# Patient Record
Sex: Female | Born: 1990 | Race: Black or African American | Hispanic: No | Marital: Single | Smoking: Never smoker
Health system: Southern US, Community
[De-identification: ages and names within clinical notes are randomized; demographics above are authoritative.]

## PROBLEM LIST (undated history)

## (undated) DIAGNOSIS — D649 Anemia, unspecified: Secondary | ICD-10-CM

## (undated) DIAGNOSIS — Z789 Other specified health status: Secondary | ICD-10-CM

## (undated) DIAGNOSIS — R87629 Unspecified abnormal cytological findings in specimens from vagina: Secondary | ICD-10-CM

## (undated) DIAGNOSIS — B009 Herpesviral infection, unspecified: Secondary | ICD-10-CM

## (undated) DIAGNOSIS — T7840XA Allergy, unspecified, initial encounter: Secondary | ICD-10-CM

## (undated) HISTORY — DX: Other specified health status: Z78.9

## (undated) HISTORY — DX: Allergy, unspecified, initial encounter: T78.40XA

## (undated) HISTORY — DX: Herpesviral infection, unspecified: B00.9

## (undated) HISTORY — DX: Unspecified abnormal cytological findings in specimens from vagina: R87.629

## (undated) HISTORY — DX: Anemia, unspecified: D64.9

---
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
## 2012-03-05 ENCOUNTER — Ambulatory Visit (INDEPENDENT_AMBULATORY_CARE_PROVIDER_SITE_OTHER): Payer: BC Managed Care – PPO | Admitting: Family Medicine

## 2012-03-05 ENCOUNTER — Ambulatory Visit: Payer: BC Managed Care – PPO

## 2012-03-05 VITALS — BP 131/76 | HR 76 | Temp 98.2°F | Resp 16 | Ht 64.0 in | Wt <= 1120 oz

## 2012-03-05 DIAGNOSIS — M79642 Pain in left hand: Secondary | ICD-10-CM

## 2012-03-05 DIAGNOSIS — M79609 Pain in unspecified limb: Secondary | ICD-10-CM

## 2012-03-05 DIAGNOSIS — L259 Unspecified contact dermatitis, unspecified cause: Secondary | ICD-10-CM

## 2012-03-05 DIAGNOSIS — T169XXA Foreign body in ear, unspecified ear, initial encounter: Secondary | ICD-10-CM

## 2012-03-05 DIAGNOSIS — L309 Dermatitis, unspecified: Secondary | ICD-10-CM

## 2012-03-05 MED ORDER — HYDROCORTISONE VALERATE 0.2 % EX OINT: TOPICAL_OINTMENT | Freq: Two times a day (BID) | CUTANEOUS | Status: DC

## 2012-03-05 MED ORDER — CETIRIZINE HCL 10 MG PO TABS: 10.0000 mg | ORAL_TABLET | Freq: Every day | ORAL | Status: DC
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
## 2012-03-05 NOTE — Progress Notes (Signed)
  Subjective:    Patient ID: Leah Cox, female    DOB: 28-Nov-1990, 21 y.o.   MRN: [id number]  HPI    Review of Systems     Objective:   Physical Exam        Assessment & Plan:  Reviewed.  Agree with assessment and plan

## 2012-03-05 NOTE — Patient Instructions (Signed)
Finger Sprain A finger sprain is a tear in one of the strong, fibrous tissues that connect the bones (ligaments) in your finger. The severity of the sprain depends on how much of the ligament is torn. The tear can be either partial or complete. CAUSES  Often, sprains are a result of a fall or accident. If you extend your hands to catch an object or to protect yourself, the force of the impact causes the fibers of your ligament to stretch too much. This excess tension causes the fibers of your ligament to tear. SYMPTOMS  You may have some loss of motion in your finger. Other symptoms include:  Bruising.   Tenderness.   Swelling.  DIAGNOSIS  In order to diagnose finger sprain, your caregiver will physically examine your finger or thumb to determine how torn the ligament is. Your caregiver may also suggest an X-ray exam of your finger to make sure no bones are broken. TREATMENT  If your ligament is only partially torn, treatment usually involves keeping the finger in a fixed position (immobilization) for a short period. To do this, your caregiver will apply a bandage, cast, or splint to keep your finger from moving until it heals. For a partially torn ligament, the healing process usually takes 2 to 3 weeks. If your ligament is completely torn, you may need surgery to reconnect the ligament to the bone. After surgery a cast or splint will be applied and will need to stay on your finger or thumb for 4 to 6 weeks while your ligament heals. HOME CARE INSTRUCTIONS  Keep your injured finger elevated, when possible, to decrease swelling.   To ease pain and swelling, apply ice to your joint twice a day, for 2 to 3 days:   Put ice in a plastic bag.   Place a towel between your skin and the bag.   Leave the ice on for 15 minutes.   Only take over-the-counter or prescription medicine for pain as directed by your caregiver.   Do not wear rings on your injured finger.   Do not leave your finger  unprotected until pain and stiffness go away (usually 3 to 4 weeks).   Do not allow your cast or splint to get wet. Cover your cast or splint with a plastic bag when you shower or bathe. Do not swim.   Your caregiver may suggest special exercises for you to do during your recovery to prevent or limit permanent stiffness.  SEEK IMMEDIATE MEDICAL CARE IF:  Your cast or splint becomes damaged.   Your pain becomes worse rather than better.  MAKE SURE YOU:  Understand these instructions.   Will watch your condition.   Will get help right away if you are not doing well or get worse.  Document Released: 10/31/2004 Document Revised: 09/12/2011 Document Reviewed: 05/27/2011 [facility] Patient Information 2012 [place], [place].  Eczema Atopic dermatitis, or eczema, is an inherited type of sensitive skin. Often people with eczema have a family history of allergies, asthma, or hay fever. It causes a red itchy rash and dry scaly skin. The itchiness may occur before the skin rash and may be very intense. It is not contagious. Eczema is generally worse during the cooler winter months and often improves with the warmth of summer. Eczema usually starts showing signs in infancy. Some children outgrow eczema, but it may last through adulthood. Flare-ups may be caused by:  Eating something or contact with something you are sensitive or allergic to.   Stress.  DIAGNOSIS  The diagnosis of eczema is usually based upon symptoms and medical history. TREATMENT  Eczema cannot be cured, but symptoms usually can be controlled with treatment or avoidance of allergens (things to which you are sensitive or allergic to).  Controlling the itching and scratching.   Use over-the-counter antihistamines as directed for itching. It is especially useful at night when the itching tends to be worse.   Use over-the-counter steroid creams as directed for itching.   Scratching makes the rash and itching worse and may cause  impetigo (a skin infection) if fingernails are contaminated (dirty).   Keeping the skin well moisturized with creams every day. This will seal in moisture and help prevent dryness. Lotions containing alcohol and water can dry the skin and are not recommended.   Limiting exposure to allergens.   Recognizing situations that cause stress.   Developing a plan to manage stress.  HOME CARE INSTRUCTIONS   Take prescription and over-the-counter medicines as directed by your caregiver.   Do not use anything on the skin without checking with your caregiver.   Keep baths or showers short (5 minutes) in warm (not hot) water. Use mild cleansers for bathing. You may add non-perfumed bath oil to the bath water. It is best to avoid soap and bubble bath.   Immediately after a bath or shower, when the skin is still damp, apply a moisturizing ointment to the entire body. This ointment should be a petroleum ointment. This will seal in moisture and help prevent dryness. The thicker the ointment the better. These should be unscented.   Keep fingernails cut short and wash hands often. If your child has eczema, it may be necessary to put soft gloves or mittens on your child at night.   Dress in clothes made of cotton or cotton blends. Dress lightly, as heat increases itching.   Avoid foods that may cause flare-ups. Common foods include cow's milk, peanut butter, eggs and wheat.   Keep a child with eczema away from anyone with fever blisters. The virus that causes fever blisters (herpes simplex) can cause a serious skin infection in children with eczema.  SEEK MEDICAL CARE IF:   Itching interferes with sleep.   The rash gets worse or is not better within one week following treatment.   The rash looks infected (pus or soft yellow scabs).   You or your child has an oral temperature above 102 F (38.9 C).   Your baby is older than 3 months with a rectal temperature of 100.5 F (38.1 C) or higher for more  than 1 day.   The rash flares up after contact with someone who has fever blisters.  SEEK IMMEDIATE MEDICAL CARE IF:   Your baby is older than 3 months with a rectal temperature of 102 F (38.9 C) or higher.   Your baby is older than 3 months or younger with a rectal temperature of 100.4 F (38 C) or higher.  Document Released: 09/20/2000 Document Revised: 09/12/2011 Document Reviewed: 07/26/2009 [facility] Patient Information 2012 [place], [place].

## 2013-03-25 ENCOUNTER — Other Ambulatory Visit: Payer: Self-pay | Admitting: *Deleted

## 2013-04-30 ENCOUNTER — Ambulatory Visit (INDEPENDENT_AMBULATORY_CARE_PROVIDER_SITE_OTHER): Payer: BC Managed Care – PPO | Admitting: Physician Assistant

## 2013-04-30 VITALS — BP 122/68 | HR 81 | Temp 98.1°F | Resp 16 | Ht 66.0 in | Wt 198.0 lb

## 2013-04-30 DIAGNOSIS — J029 Acute pharyngitis, unspecified: Secondary | ICD-10-CM

## 2013-04-30 LAB — POCT RAPID STREP A (OFFICE): Rapid Strep A Screen: NEGATIVE

## 2013-04-30 MED ORDER — TRIAMCINOLONE ACETONIDE(NASAL) 55 MCG/ACT NA INHA: 2.0000 | Freq: Every day | NASAL | Status: DC

## 2013-04-30 MED ORDER — GUAIFENESIN ER 1200 MG PO TB12: 1.0000 | ORAL_TABLET | Freq: Two times a day (BID) | ORAL | Status: AC

## 2013-04-30 NOTE — Progress Notes (Signed)
   [address] [place] [address]   Phone [phone]  Subjective:    Patient ID: Leah Cox, female    DOB: 1991-08-12, 22 y.o.   MRN: [id number]  HPI Pt presents to clinic with sore throat that is intermittent - worse at night but it never goes away and she feels like from when it started 1 week ago the pain is worse overall.  She has some congestion but no rhinorrhea and she has seasonal allergies but this feels different.  She is having trouble at work because she talks a lot and her voice keeps getting hoarse.    No strep exposures. Works with the public. OTC meds - cold preps without much relief.  Review of Systems  Constitutional: Positive for fever (about 100 at onset of illness). Negative for chills.  HENT: Positive for congestion, sore throat (worse at night), voice change (intermittent voice loss) and postnasal drip. Negative for rhinorrhea.   Respiratory: Positive for cough.   Gastrointestinal: Negative for nausea, vomiting and diarrhea.  Musculoskeletal: Positive for myalgias.  Neurological: Positive for headaches.       Objective:   Physical Exam  Vitals reviewed. Constitutional: She is oriented to person, place, and time. She appears well-developed and well-nourished.  HENT:  Head: Normocephalic and atraumatic.  Right Ear: Hearing, tympanic membrane, external ear and ear canal normal.  Left Ear: Hearing, tympanic membrane, external ear and ear canal normal.  Nose: Mucosal edema (pale and swollen) present.  Mouth/Throat: Uvula is midline, oropharynx is clear and moist and mucous membranes are normal.  Eyes: Conjunctivae are normal.  Cardiovascular: Normal rate, regular rhythm and normal heart sounds.   No murmur heard. Pulmonary/Chest: Effort normal and breath sounds normal.  Neurological: She is alert and oriented to person, place, and time.  Skin: Skin is warm and dry.  Psychiatric: She has a normal mood and affect. Her behavior is normal. Judgment and  thought content normal.          Assessment & Plan:  Acute pharyngitis - Plan: POCT rapid strep A, Guaifenesin (MUCINEX MAXIMUM STRENGTH) 1200 MG TB12, triamcinolone (NASACORT) 55 MCG/ACT nasal inhaler  Push fluids.  Tylenol/motrin prn.  Benny Lennert PA-C 04/30/2013 11:22 AM

## 2013-05-25 ENCOUNTER — Encounter (HOSPITAL_COMMUNITY): Payer: Self-pay | Admitting: Emergency Medicine

## 2013-05-25 ENCOUNTER — Emergency Department (INDEPENDENT_AMBULATORY_CARE_PROVIDER_SITE_OTHER)
Admission: EM | Admit: 2013-05-25 | Discharge: 2013-05-25 | Disposition: A | Discharge status: Home / Self Care | Payer: Federal, State, Local not specified - PPO | Source: Home / Self Care | Attending: Emergency Medicine | Admitting: Emergency Medicine

## 2013-05-25 DIAGNOSIS — M549 Dorsalgia, unspecified: Secondary | ICD-10-CM

## 2013-05-25 MED ORDER — CYCLOBENZAPRINE HCL 5 MG PO TABS: 5.0000 mg | ORAL_TABLET | Freq: Three times a day (TID) | ORAL | Status: DC | PRN

## 2013-05-25 MED ORDER — IBUPROFEN 800 MG PO TABS: 800.0000 mg | ORAL_TABLET | Freq: Three times a day (TID) | ORAL | Status: DC

## 2013-05-25 NOTE — ED Provider Notes (Signed)
Medical screening examination/treatment/procedure(s) were performed by non-physician practitioner and as supervising physician I was immediately available for consultation/collaboration.  Bolivar Koranda, M.D.  Anishka Bushard C Takina Busser, MD 05/25/13 2214 
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
## 2013-10-06 ENCOUNTER — Other Ambulatory Visit: Payer: Self-pay | Admitting: Family

## 2013-10-06 DIAGNOSIS — R590 Localized enlarged lymph nodes: Secondary | ICD-10-CM

## 2013-10-18 ENCOUNTER — Ambulatory Visit
Admission: RE | Admit: 2013-10-18 | Discharge: 2013-10-18 | Disposition: A | Discharge status: Home / Self Care | Payer: Federal, State, Local not specified - PPO | Source: Ambulatory Visit | Attending: Family | Admitting: Family

## 2013-10-18 DIAGNOSIS — R590 Localized enlarged lymph nodes: Secondary | ICD-10-CM

## 2014-03-23 ENCOUNTER — Ambulatory Visit (INDEPENDENT_AMBULATORY_CARE_PROVIDER_SITE_OTHER): Payer: BC Managed Care – PPO | Admitting: Family Medicine

## 2014-03-23 VITALS — BP 102/70 | HR 66 | Temp 97.9°F | Resp 16 | Ht 64.0 in | Wt 192.2 lb

## 2014-03-23 DIAGNOSIS — G8929 Other chronic pain: Secondary | ICD-10-CM

## 2014-03-23 DIAGNOSIS — R519 Headache, unspecified: Secondary | ICD-10-CM

## 2014-03-23 DIAGNOSIS — R51 Headache: Secondary | ICD-10-CM

## 2014-03-23 DIAGNOSIS — R3915 Urgency of urination: Secondary | ICD-10-CM

## 2014-03-23 LAB — POCT UA - MICROSCOPIC ONLY
Casts, Ur, LPF, POC: NEGATIVE
Crystals, Ur, HPF, POC: NEGATIVE
Mucus, UA: NEGATIVE
Yeast, UA: NEGATIVE

## 2014-03-23 LAB — POCT URINALYSIS DIPSTICK
Bilirubin, UA: NEGATIVE
Glucose, UA: NEGATIVE
Ketones, UA: NEGATIVE
Nitrite, UA: POSITIVE
Protein, UA: NEGATIVE
Spec Grav, UA: 1.01
Urobilinogen, UA: 1
pH, UA: 6.5

## 2014-03-23 MED ORDER — TOPIRAMATE 50 MG PO TABS: 50.0000 mg | ORAL_TABLET | Freq: Every day | ORAL | Status: DC

## 2014-03-23 MED ORDER — NITROFURANTOIN MONOHYD MACRO 100 MG PO CAPS: 100.0000 mg | ORAL_CAPSULE | Freq: Two times a day (BID) | ORAL | Status: DC

## 2014-03-23 NOTE — Progress Notes (Signed)
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  The urinary frequency was intially accompanied by bad odor.  Not currently sexually active>  LMP May 20th.  The headaches are frontal and throbbing, lasting several hours.  She has had years of headaches, but last couple months they have been more frequent.  No vomiting.  She has associated photophobia  No fever.  Objective:  NAD No CVAT Results for orders placed in visit on 03/23/14  POCT UA - MICROSCOPIC ONLY      Result Value Ref Range   WBC, Ur, HPF, POC 10-20     RBC, urine, microscopic 1-2     Bacteria, U Microscopic 4+     Mucus, UA neg     Epithelial cells, urine per micros 1-3     Crystals, Ur, HPF, POC neg     Casts, Ur, LPF, POC neg     Yeast, UA neg    POCT URINALYSIS DIPSTICK      Result Value Ref Range   Color, UA yellow     Clarity, UA cloudy     Glucose, UA neg     Bilirubin, UA neg     Ketones, UA neg     Spec Grav, UA 1.010     Blood, UA trace     pH, UA 6.5     Protein, UA neg     Urobilinogen, UA 1.0     Nitrite, UA positive     Leukocytes, UA large (3+)     Urinary urgency - Plan: POCT UA - Microscopic Only, POCT urinalysis dipstick, Urine culture, nitrofurantoin, macrocrystal-monohydrate, (MACROBID) 100 MG capsule, CANCELED: POCT UA - Microscopic Only, CANCELED: POCT urinalysis dipstick  Chronic headaches - Plan: topiramate (TOPAMAX) 50 MG tablet

## 2014-03-26 LAB — URINE CULTURE: Colony Count: >=100000

## 2014-07-01 ENCOUNTER — Ambulatory Visit (INDEPENDENT_AMBULATORY_CARE_PROVIDER_SITE_OTHER): Payer: BC Managed Care – PPO | Admitting: Family Medicine

## 2014-07-01 VITALS — BP 132/70 | HR 78 | Temp 97.9°F | Resp 16 | Ht 64.0 in | Wt 197.0 lb

## 2014-07-01 DIAGNOSIS — Q181 Preauricular sinus and cyst: Secondary | ICD-10-CM

## 2014-07-01 NOTE — Patient Instructions (Signed)
Leave your cyst alone- do not try to squeeze it. If it does not resolve over the next 3-4 weeks give me a call and I will refer you to an ENT doctor to take a look at it.  Let me know sooner if you have any other problems!

## 2014-07-01 NOTE — Progress Notes (Signed)
[facility] [address], [place] [address] [phone]  Date:  07/01/2014   Name:  Leah Cox   DOB:  04-Oct-1991   MRN:  [id number]  PCP:  No PCP Per Patient    Chief Complaint: bump by right ear   History of Present Illness:  Leah Cox is a 23 y.o. very pleasant female patient who presents with the following:  She has noted a tender bump in front of the right ear for about one week.  No ear pain, no ST, cough, no fever.   She is generally healthy  She uses topamax for headache prevention.    There are no active problems to display for this patient.   Past Medical History  Diagnosis Date  . Allergy     seasonal    History reviewed. No pertinent past surgical history.  History  Substance Use Topics  . Smoking status: Never Smoker   . Smokeless tobacco: Never Used  . Alcohol Use: No    Family History  Problem Relation Age of Onset  . Hypertension Mother   . Diabetes Sister     Insulin dependent    Allergies  Allergen Reactions  . Codeine Swelling    Medication list has been reviewed and updated.  Current Outpatient Prescriptions on File Prior to Visit  Medication Sig Dispense Refill  . ibuprofen (ADVIL,MOTRIN) 800 MG tablet Take 1 tablet (800 mg total) by mouth 3 (three) times daily.  30 tablet  0  . nitrofurantoin, macrocrystal-monohydrate, (MACROBID) 100 MG capsule Take 1 capsule (100 mg total) by mouth 2 (two) times daily.  14 capsule  0  . topiramate (TOPAMAX) 50 MG tablet Take 1 tablet (50 mg total) by mouth at bedtime.  30 tablet  6   No current facility-administered medications on file prior to visit.    Review of Systems:  As per HPI- otherwise negative.   Physical Examination: Filed Vitals:   07/01/14 1226  BP: 132/70  Pulse: 78  Temp: 97.9 F (36.6 C)  Resp: 16   Filed Vitals:   07/01/14 1226  Height:  (1.626 m)  Weight: 197 lb (89.359 kg)   Body mass index is 33.8 kg/(m^2). Ideal Body  Weight: Weight in (lb) to have BMI = 25: 145.3  GEN: WDWN, NAD, Non-toxic, A & O x 3, overweight, looks well HEENT: Atraumatic, Normocephalic. Neck supple. No masses, No LAD.  Bilateral TM wnl, oropharynx normal.  PEERL,EOMI.  There is a tiny cyst anterior to her right ear.  It is mobile and slightly tender.  She does have some crusting and irritation of her bilateral ear lobes due to a known nickel allergy- admits she is not always good at avoiding nickel jewelry  No other abnormal LAD noted.   Ears and Nose: No external deformity. CV: RRR, No M/G/R. No JVD. No thrill. No extra heart sounds. PULM: CTA B, no wheezes, crackles, rhonchi. No retractions. No resp. distress. No accessory muscle use. EXTR: No c/c/e NEURO Normal gait.  PSYCH: Normally interactive. Conversant. Not depressed or anxious appearing.  Calm demeanor. \  Assessment and Plan: Preauricular cyst  reassurance if not resolved in a few weeks I will refer her to ENT for further evaluation   Signed Abbe Amsterdam, MD

## 2014-09-24 ENCOUNTER — Ambulatory Visit (INDEPENDENT_AMBULATORY_CARE_PROVIDER_SITE_OTHER): Payer: BC Managed Care – PPO | Admitting: Family Medicine

## 2014-09-24 VITALS — BP 106/64 | HR 86 | Temp 98.2°F | Resp 16 | Ht 64.0 in | Wt 187.8 lb

## 2014-09-24 DIAGNOSIS — G44229 Chronic tension-type headache, not intractable: Secondary | ICD-10-CM

## 2014-09-24 MED ORDER — IBUPROFEN 800 MG PO TABS: 800.0000 mg | ORAL_TABLET | Freq: Three times a day (TID) | ORAL | Status: DC

## 2014-09-24 NOTE — Patient Instructions (Signed)
Headaches, Frequently Asked Questions MIGRAINE HEADACHES Q: What is migraine? What causes it? How can I treat it? A: Generally, migraine headaches begin as a dull ache. Then they develop into a constant, throbbing, and pulsating pain. You may experience pain at the temples. You may experience pain at the front or back of one or both sides of the head. The pain is usually accompanied by a combination of:  Nausea.  Vomiting.  Sensitivity to light and noise. Some people (about 15%) experience an aura (see below) before an attack. The cause of migraine is believed to be chemical reactions in the brain. Treatment for migraine may include over-the-counter or prescription medications. It may also include self-help techniques. These include relaxation training and biofeedback.  Q: What is an aura? A: About 15% of people with migraine get an "aura". This is a sign of neurological symptoms that occur before a migraine headache. You may see wavy or jagged lines, dots, or flashing lights. You might experience tunnel vision or blind spots in one or both eyes. The aura can include visual or auditory hallucinations (something imagined). It may include disruptions in smell (such as strange odors), taste or touch. Other symptoms include:  Numbness.  A "pins and needles" sensation.  Difficulty in recalling or speaking the correct word. These neurological events may last as long as 60 minutes. These symptoms will fade as the headache begins. Q: What is a trigger? A: Certain physical or environmental factors can lead to or "trigger" a migraine. These include:  Foods.  Hormonal changes.  Weather.  Stress. It is important to remember that triggers are different for everyone. To help prevent migraine attacks, you need to figure out which triggers affect you. Keep a headache diary. This is a good way to track triggers. The diary will help you talk to your healthcare professional about your condition. Q: Does  weather affect migraines? A: Bright sunshine, hot, humid conditions, and drastic changes in barometric pressure may lead to, or "trigger," a migraine attack in some people. But studies have shown that weather does not act as a trigger for everyone with migraines. Q: What is the link between migraine and hormones? A: Hormones start and regulate many of your body's functions. Hormones keep your body in balance within a constantly changing environment. The levels of hormones in your body are unbalanced at times. Examples are during menstruation, pregnancy, or menopause. That can lead to a migraine attack. In fact, about three quarters of all women with migraine report that their attacks are related to the menstrual cycle.  Q: Is there an increased risk of stroke for migraine sufferers? A: The likelihood of a migraine attack causing a stroke is very remote. That is not to say that migraine sufferers cannot have a stroke associated with their migraines. In persons under age 40, the most common associated factor for stroke is migraine headache. But over the course of a person's normal life span, the occurrence of migraine headache may actually be associated with a reduced risk of dying from cerebrovascular disease due to stroke.  Q: What are acute medications for migraine? A: Acute medications are used to treat the pain of the headache after it has started. Examples over-the-counter medications, NSAIDs, ergots, and triptans.  Q: What are the triptans? A: Triptans are the newest class of abortive medications. They are specifically targeted to treat migraine. Triptans are vasoconstrictors. They moderate some chemical reactions in the brain. The triptans work on receptors in your brain. Triptans help   to restore the balance of a neurotransmitter called serotonin. Fluctuations in levels of serotonin are thought to be a main cause of migraine.  Q: Are over-the-counter medications for migraine effective? A:  Over-the-counter, or "OTC," medications may be effective in relieving mild to moderate pain and associated symptoms of migraine. But you should see your caregiver before beginning any treatment regimen for migraine.  Q: What are preventive medications for migraine? A: Preventive medications for migraine are sometimes referred to as "prophylactic" treatments. They are used to reduce the frequency, severity, and length of migraine attacks. Examples of preventive medications include antiepileptic medications, antidepressants, beta-blockers, calcium channel blockers, and NSAIDs (nonsteroidal anti-inflammatory drugs). Q: Why are anticonvulsants used to treat migraine? A: During the past few years, there has been an increased interest in antiepileptic drugs for the prevention of migraine. They are sometimes referred to as "anticonvulsants". Both epilepsy and migraine may be caused by similar reactions in the brain.  Q: Why are antidepressants used to treat migraine? A: Antidepressants are typically used to treat people with depression. They may reduce migraine frequency by regulating chemical levels, such as serotonin, in the brain.  Q: What alternative therapies are used to treat migraine? A: The term "alternative therapies" is often used to describe treatments considered outside the scope of conventional Western medicine. Examples of alternative therapy include acupuncture, acupressure, and yoga. Another common alternative treatment is herbal therapy. Some herbs are believed to relieve headache pain. Always discuss alternative therapies with your caregiver before proceeding. Some herbal products contain arsenic and other toxins. TENSION HEADACHES Q: What is a tension-type headache? What causes it? How can I treat it? A: Tension-type headaches occur randomly. They are often the result of temporary stress, anxiety, fatigue, or anger. Symptoms include soreness in your temples, a tightening band-like sensation  around your head (a "vice-like" ache). Symptoms can also include a pulling feeling, pressure sensations, and contracting head and neck muscles. The headache begins in your forehead, temples, or the back of your head and neck. Treatment for tension-type headache may include over-the-counter or prescription medications. Treatment may also include self-help techniques such as relaxation training and biofeedback. CLUSTER HEADACHES Q: What is a cluster headache? What causes it? How can I treat it? A: Cluster headache gets its name because the attacks come in groups. The pain arrives with little, if any, warning. It is usually on one side of the head. A tearing or bloodshot eye and a runny nose on the same side of the headache may also accompany the pain. Cluster headaches are believed to be caused by chemical reactions in the brain. They have been described as the most severe and intense of any headache type. Treatment for cluster headache includes prescription medication and oxygen. SINUS HEADACHES Q: What is a sinus headache? What causes it? How can I treat it? A: When a cavity in the bones of the face and skull (a sinus) becomes inflamed, the inflammation will cause localized pain. This condition is usually the result of an allergic reaction, a tumor, or an infection. If your headache is caused by a sinus blockage, such as an infection, you will probably have a fever. An x-ray will confirm a sinus blockage. Your caregiver's treatment might include antibiotics for the infection, as well as antihistamines or decongestants.  REBOUND HEADACHES Q: What is a rebound headache? What causes it? How can I treat it? A: A pattern of taking acute headache medications too often can lead to a condition known as "rebound headache."   A pattern of taking too much headache medication includes taking it more than 2 days per week or in excessive amounts. That means more than the label or a caregiver advises. With rebound  headaches, your medications not only stop relieving pain, they actually begin to cause headaches. Doctors treat rebound headache by tapering the medication that is being overused. Sometimes your caregiver will gradually substitute a different type of treatment or medication. Stopping may be a challenge. Regularly overusing a medication increases the potential for serious side effects. Consult a caregiver if you regularly use headache medications more than 2 days per week or more than the label advises. ADDITIONAL QUESTIONS AND ANSWERS Q: What is biofeedback? A: Biofeedback is a self-help treatment. Biofeedback uses special equipment to monitor your body's involuntary physical responses. Biofeedback monitors:  Breathing.  Pulse.  Heart rate.  Temperature.  Muscle tension.  Brain activity. Biofeedback helps you refine and perfect your relaxation exercises. You learn to control the physical responses that are related to stress. Once the technique has been mastered, you do not need the equipment any more. Q: Are headaches hereditary? A: Four out of five (80%) of people that suffer report a family history of migraine. Scientists are not sure if this is genetic or a family predisposition. Despite the uncertainty, a child has a 50% chance of having migraine if one parent suffers. The child has a 75% chance if both parents suffer.  Q: Can children get headaches? A: By the time they reach high school, most young people have experienced some type of headache. Many safe and effective approaches or medications can prevent a headache from occurring or stop it after it has begun.  Q: What type of doctor should I see to diagnose and treat my headache? A: Start with your primary caregiver. Discuss his or her experience and approach to headaches. Discuss methods of classification, diagnosis, and treatment. Your caregiver may decide to recommend you to a headache specialist, depending upon your symptoms or other  physical conditions. Having diabetes, allergies, etc., may require a more comprehensive and inclusive approach to your headache. The National Headache Foundation will provide, upon request, a list of [facility] physician members in your state. Document Released: 12/14/2003 Document Revised: 12/16/2011 Document Reviewed: 05/23/2008 [facility] Patient Information 2015 [place], [place]. This information is not intended to replace advice given to you by your health care provider. Make sure you discuss any questions you have with your health care provider. Ibuprofen tablets and capsules What is this medicine? IBUPROFEN (eye BYOO proe fen) is a non-steroidal anti-inflammatory drug (NSAID). It is used for dental pain, fever, headaches or migraines, osteoarthritis, rheumatoid arthritis, or painful monthly periods. It can also relieve minor aches and pains caused by a cold, flu, or sore throat. This medicine may be used for other purposes; ask your health care provider or pharmacist if you have questions. COMMON BRAND NAME(S): Advil, Advil Junior Strength, Advil Migraine, Genpril, Ibren, IBU, Midol, Midol Cramps and Body Aches, Motrin, Motrin IB, Motrin Junior Strength, Motrin Migraine Pain, Samson-8, Toxicology Saliva Collection What should I tell my health care provider before I take this medicine? They need to know if you have any of these conditions: -asthma -cigarette smoker -drink more than 3 alcohol containing drinks a day -heart disease or circulation problems such as heart failure or leg edema (fluid retention) -high blood pressure -kidney disease -liver disease -stomach bleeding or ulcers -an unusual or allergic reaction to ibuprofen, aspirin, other NSAIDS, other medicines, foods, dyes, or preservatives -  pregnant or trying to get pregnant -breast-feeding How should I use this medicine? Take this medicine by mouth with a glass of water. Follow the directions on the prescription label. Take this medicine  with food if your stomach gets upset. Try to not lie down for at least 10 minutes after you take the medicine. Take your medicine at regular intervals. Do not take your medicine more often than directed. A special MedGuide will be given to you by the pharmacist with each prescription and refill. Be sure to read this information carefully each time. Talk to your pediatrician regarding the use of this medicine in children. Special care may be needed. Overdosage: If you think you have taken too much of this medicine contact a poison control center or emergency room at once. NOTE: This medicine is only for you. Do not share this medicine with others. What if I miss a dose? If you miss a dose, take it as soon as you can. If it is almost time for your next dose, take only that dose. Do not take double or extra doses. What may interact with this medicine? Do not take this medicine with any of the following medications: -cidofovir -ketorolac -methotrexate -pemetrexed This medicine may also interact with the following medications: -alcohol -aspirin -diuretics -lithium -other drugs for inflammation like prednisone -warfarin This list may not describe all possible interactions. Give your health care provider a list of all the medicines, herbs, non-prescription drugs, or dietary supplements you use. Also tell them if you smoke, drink alcohol, or use illegal drugs. Some items may interact with your medicine. What should I watch for while using this medicine? Tell your doctor or healthcare professional if your symptoms do not start to get better or if they get worse. This medicine does not prevent heart attack or stroke. In fact, this medicine may increase the chance of a heart attack or stroke. The chance may increase with longer use of this medicine and in people who have heart disease. If you take aspirin to prevent heart attack or stroke, talk with your doctor or health care professional. Do not take  other medicines that contain aspirin, ibuprofen, or naproxen with this medicine. Side effects such as stomach upset, nausea, or ulcers may be more likely to occur. Many medicines available without a prescription should not be taken with this medicine. This medicine can cause ulcers and bleeding in the stomach and intestines at any time during treatment. Ulcers and bleeding can happen without warning symptoms and can cause death. To reduce your risk, do not smoke cigarettes or drink alcohol while you are taking this medicine. You may get drowsy or dizzy. Do not drive, use machinery, or do anything that needs mental alertness until you know how this medicine affects you. Do not stand or sit up quickly, especially if you are an older patient. This reduces the risk of dizzy or fainting spells. This medicine can cause you to bleed more easily. Try to avoid damage to your teeth and gums when you brush or floss your teeth. This medicine may be used to treat migraines. If you take migraine medicines for 10 or more days a month, your migraines may get worse. Keep a diary of headache days and medicine use. Contact your healthcare professional if your migraine attacks occur more frequently. What side effects may I notice from receiving this medicine? Side effects that you should report to your doctor or health care professional as soon as possible: -allergic reactions like skin  rash, itching or hives, swelling of the face, lips, or tongue -severe stomach pain -signs and symptoms of bleeding such as bloody or black, tarry stools; red or dark-brown urine; spitting up blood or brown material that looks like coffee grounds; red spots on the skin; unusual bruising or bleeding from the eye, gums, or nose -signs and symptoms of a blood clot such as changes in vision; chest pain; severe, sudden headache; trouble speaking; sudden numbness or weakness of the face, arm, or leg -unexplained weight gain or swelling -unusually  weak or tired -yellowing of eyes or skin Side effects that usually do not require medical attention (report to your doctor or health care professional if they continue or are bothersome): -bruising -diarrhea -dizziness, drowsiness -headache -nausea, vomiting This list may not describe all possible side effects. Call your doctor for medical advice about side effects. You may report side effects to FDA at 1-800-FDA-1088. Where should I keep my medicine? Keep out of the reach of children. Store at room temperature between 15 and 30 degrees C (59 and 86 degrees F). Keep container tightly closed. Throw away any unused medicine after the expiration date. NOTE: This sheet is a summary. It may not cover all possible information. If you have questions about this medicine, talk to your doctor, pharmacist, or health care provider.  2015, [employer] Standard. (2013-05-25 10:48:02) Analgesic Rebound Headaches An analgesic rebound headache is a headache that returns after pain medicine (analgesic) that was taken to treat the initial headache wears off. People who suffer from tension, migraine, or cluster headaches are at risk for developing rebound headaches. Any type of primary headache can return as a rebound headache if you regularly take analgesics more than three times a week. If the cycle of rebound headaches continues, they become chronic daily headaches.  CAUSES Analgesics frequently associated with this problem include common over-the-counter medicines like aspirin, ibuprofen, acetaminophen, sinus relief medicines, and other medicines that contain caffeine. Narcotic pain medicines are also a common cause of rebound headaches.  SIGNS AND SYMPTOMS The symptoms of rebound headaches are the same as the symptoms of your initial headache. Symptoms of specific types of headaches include: Tension headache  Pressure around the head.  Dull, aching head pain.  Pain felt over the front and sides of the  head.  Tenderness in the muscles of the head, neck and shoulders. Migraine Headache  Pulsing or throbbing pain on one or both sides of the head.  Severe pain that interferes with daily activities.  Pain that is worsened by physical activity.  Nausea, vomiting, or both.  Pain with exposure to bright light, loud noises, or strong smells.  General sensitivity to bright light, loud noises, or strong smells.  Visual changes.  Numbness of one or both arms. Cluster Headaches  Severe pain that begins in or around one eye or temple.  Redness in the eye on the same side as the pain.  Droopy or swollen eyelid.  One-sided head pain.  Nausea.  Runny nose.  Sweaty, pale facial skin.  Restlessness. DIAGNOSIS  Analgesic rebound headaches are diagnosed by reviewing your medical history. This includes the nature of your initial headaches, as well as the type of pain medicines you have been using to treat your headaches and how often you take them. TREATMENT Discontinuing frequent use of the analgesic medicine will typically reduce the frequency of the rebound episodes. This may initially worsen your headaches but eventually the pain should become more manageable, less frequent, and less severe.  Seeing a headache specialists may helpful. He or she may be able to help you manage your headaches and to make sure there is not another cause of the headaches. Alternative methods of stress relief such as acupuncture, counseling, biofeedback, and massage may also be helpful. Talk with your health care provider about which alternative treatments might be good for you. HOME CARE INSTRUCTIONS Stopping the regular use of pain medicine can be difficult. Follow your health care provider's instructions carefully. Keep all of your appointments. Avoid triggers that are known to cause your primary headaches. SEEK MEDICAL CARE IF: You continue to experience headaches after following your health care  provider's recommended treatments. SEEK IMMEDIATE MEDICAL CARE IF:  You develop new headache pain.  You develop headache pain that is different than what you have experienced in the past.  You develop numbness or tingling in your arms or legs.  You develop changes in your speech or vision. MAKE SURE YOU:  Understand these instructions.  Will watch your child's condition.  Will get help right away if your child is not doing well or gets worse. Document Released: 12/14/2003 Document Revised: 02/07/2014 Document Reviewed: 04/08/2013 [facility] Patient Information 2015 [place], [place]. This information is not intended to replace advice given to you by your health care provider. Make sure you discuss any questions you have with your health care provider.

## 2014-09-24 NOTE — Progress Notes (Signed)
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 Once or twice a week she developed a headache which is treated successfully with ibuprofen 800 mg. She never has a headache with twice a week, and one ibuprofen works. She does not have nausea, visual disturbance, loss of balance, or fever  Patient's headaches are constant and frontal  Patient's had this problem for over 6 months. She's come trouble taking the ibuprofen and understands that taking higher doses can lead to kidney problems.  Objective: No acute distress Fundi normal Ears: Normal Oropharynx: Normal : Neck supple, no adenopathy or thyromegaly Chest: Clear Heart: Regular no murmur Skin: Warm and dry without rash  Assessment: This chart was scribed in my presence and reviewed by me personally.    ICD-9-CM ICD-10-CM   1. Chronic tension-type headache, not intractable 339.12 G44.229 ibuprofen (ADVIL,MOTRIN) 800 MG tablet     Signed, Elvina SidleKurt Nadie Fiumara, MD

## 2015-06-26 ENCOUNTER — Ambulatory Visit (INDEPENDENT_AMBULATORY_CARE_PROVIDER_SITE_OTHER): Payer: Federal, State, Local not specified - PPO | Admitting: Family Medicine

## 2015-06-26 ENCOUNTER — Encounter: Payer: Self-pay | Admitting: Family Medicine

## 2015-06-26 VITALS — BP 125/75 | HR 85 | Temp 98.6°F | Resp 16 | Ht 64.5 in | Wt 191.0 lb

## 2015-06-26 DIAGNOSIS — H6012 Cellulitis of left external ear: Secondary | ICD-10-CM

## 2015-06-26 MED ORDER — DOXYCYCLINE HYCLATE 100 MG PO TABS: 100.0000 mg | ORAL_TABLET | Freq: Two times a day (BID) | ORAL | Status: DC

## 2015-06-26 MED ORDER — MUPIROCIN 2 % EX OINT: 1.0000 "application " | TOPICAL_OINTMENT | Freq: Three times a day (TID) | CUTANEOUS | Status: DC

## 2015-06-26 NOTE — Progress Notes (Signed)
Subjective:  This chart was scribed for Meredith Staggers, MD by Stann Ore, Medical Scribe. This patient was seen in room 25 and the patient's care was started 1:04 PM.    Patient ID: Leah Cox, female    DOB: 31-Aug-1991, 24 y.o.   MRN: [id number]  HPI Leah Cox is a 24 y.o. female Pt is here with swollen left ear lobe.  Initially she noticed some ear swelling 2 nights ago and touching area was sore. She removed the earrings immediately. She says the swelling went down. She cleaned the area with alcohol, 3 times a day. She denies any new earrings in the ear. She denies fever, hearing loss, tinnitus, ear discharge or ear drainage.    There are no active problems to display for this patient.  Past Medical History  Diagnosis Date  . Allergy     seasonal   History reviewed. No pertinent past surgical history. Allergies  Allergen Reactions  . Codeine Swelling   Prior to Admission medications   Medication Sig Start Date End Date Taking? Authorizing Provider  ibuprofen (ADVIL,MOTRIN) 800 MG tablet Take 1 tablet (800 mg total) by mouth 3 (three) times daily. 09/24/14   Elvina Sidle, MD   Social History   Social History  . Marital Status: Single    Spouse Name: N/A  . Number of Children: N/A  . Years of Education: N/A   Occupational History  . Not on file.   Social History Main Topics  . Smoking status: Never Smoker   . Smokeless tobacco: Never Used  . Alcohol Use: No  . Drug Use: No  . Sexual Activity: No   Other Topics Concern  . Not on file   Social History Narrative    Review of Systems  Constitutional: Negative for fever.  HENT: Positive for ear pain. Negative for ear discharge, hearing loss and tinnitus.        Objective:   Physical Exam  Constitutional: She is oriented to person, place, and time. She appears well-developed and well-nourished. No distress.  HENT:  Head: Normocephalic and atraumatic.  Right Ear: Hearing, tympanic membrane,  external ear and ear canal normal.  Left Ear: Hearing, tympanic membrane, external ear and ear canal normal.  Nose: Nose normal.  Mouth/Throat: Oropharynx is clear and moist. No oropharyngeal exudate.  Eyes: Conjunctivae and EOM are normal. Pupils are equal, round, and reactive to light.  Neck: No thyromegaly present.  Cardiovascular: Normal rate, regular rhythm, normal heart sounds and intact distal pulses.   No murmur heard. Pulmonary/Chest: Effort normal and breath sounds normal. No respiratory distress. She has no wheezes. She has no rhonchi.  Musculoskeletal:  Right pinna without swelling Left ear: upper pinna has previous piercing appears scarred over, lower lobe 2 piercing; upper most piercing on anterior aspect, there is some crusted serous drainage with slight peeling of surrounding skin, minimal edema and warmth; slight induration upper piercing, lower without peeling or discharge; no discharge was expressed.   Lymphadenopathy:    She has no cervical adenopathy.  Neurological: She is alert and oriented to person, place, and time.  Skin: Skin is warm and dry. No rash noted.  Psychiatric: She has a normal mood and affect. Her behavior is normal.  Vitals reviewed.   Filed Vitals:   06/26/15 1258  BP: 125/75  Pulse: 85  Temp: 98.6 F (37 C)  TempSrc: Oral  Resp: 16  Height: 5' 4.5" (1.638 m)  Weight: 191 lb (86.637 kg)  SpO2:  100%        Assessment & Plan:   Leah Cox is a 24 y.o. female Cellulitis of ear, left - Plan: mupirocin ointment (BACTROBAN) 2 %, doxycycline (VIBRA-TABS) 100 MG tablet  - cellulitis vs contact derm from nickel or other metal in earring with secondary infection.  Denies using earring in upper pierced area that is most affected.   -as improving and no LAD noted today - can start with Bactroban topically tid for 1 wk, but if not improving or any worsening in the meantime - start doxycycline and follow up in 24-48 hrs if oral abx needed. RTC  precautions and wound care discussed.    Meds ordered this encounter  Medications  . mupirocin ointment (BACTROBAN) 2 %    Sig: Apply 1 application topically 3 (three) times daily. For 7 days.    Dispense:  22 g    Refill:  0  . doxycycline (VIBRA-TABS) 100 MG tablet    Sig: Take 1 tablet (100 mg total) by mouth 2 (two) times daily.    Dispense:  20 tablet    Refill:  0   Patient Instructions  As infection is improving - can just apply Bactroban ointment for now 3 times per day, soap and water cleansing of area twice per day.  Leave out earring for next week. If not continuing to improve tomorrow/tomorrow night - can start oral antibioti and follow up in next 2-3 days.  Return to the clinic or go to the nearest emergency room if any of your symptoms worsen or new symptoms occur.  Cellulitis Cellulitis is an infection of the skin and the tissue beneath it. The infected area is usually red and tender. Cellulitis occurs most often in the arms and lower legs.  CAUSES  Cellulitis is caused by bacteria that enter the skin through cracks or cuts in the skin. The most common types of bacteria that cause cellulitis are staphylococci and streptococci. SIGNS AND SYMPTOMS   Redness and warmth.  Swelling.  Tenderness or pain.  Fever. DIAGNOSIS  Your health care provider can usually determine what is wrong based on a physical exam. Blood tests may also be done. TREATMENT  Treatment usually involves taking an antibiotic medicine. HOME CARE INSTRUCTIONS   Take your antibiotic medicine as directed by your health care provider. Finish the antibiotic even if you start to feel better.  Keep the infected arm or leg elevated to reduce swelling.  Apply a warm cloth to the affected area up to 4 times per day to relieve pain.  Take medicines only as directed by your health care provider.  Keep all follow-up visits as directed by your health care provider. SEEK MEDICAL CARE IF:   You notice red  streaks coming from the infected area.  Your red area gets larger or turns dark in color.  Your bone or joint underneath the infected area becomes painful after the skin has healed.  Your infection returns in the same area or another area.  You notice a swollen bump in the infected area.  You develop new symptoms.  You have a fever. SEEK IMMEDIATE MEDICAL CARE IF:   You feel very sleepy.  You develop vomiting or diarrhea.  You have a general ill feeling (malaise) with muscle aches and pains. MAKE SURE YOU:   Understand these instructions.  Will watch your condition.  Will get help right away if you are not doing well or get worse. Document Released: 07/03/2005 Document Revised: 02/07/2014  Document Reviewed: 12/09/2011 [facility] Patient Information 2015 [place], [place]. This information is not intended to replace advice given to you by your health care provider. Make sure you discuss any questions you have with your health care provider.      I personally performed the services described in this documentation, which was scribed in my presence. The recorded information has been reviewed and considered, and addended by me as needed.    By signing my name below, I, Stann Ore, attest that this documentation has been prepared under the direction and in the presence of Meredith Staggers, MD. Electronically Signed: Stann Ore, Scribe. 06/26/2015 , 1:04 PM .

## 2015-06-26 NOTE — Patient Instructions (Signed)
As infection is improving - can just apply Bactroban ointment for now 3 times per day, soap and water cleansing of area twice per day.  Leave out earring for next week. If not continuing to improve tomorrow/tomorrow night - can start oral antibioti and follow up in next 2-3 days.  Return to the clinic or go to the nearest emergency room if any of your symptoms worsen or new symptoms occur.  Cellulitis Cellulitis is an infection of the skin and the tissue beneath it. The infected area is usually red and tender. Cellulitis occurs most often in the arms and lower legs.  CAUSES  Cellulitis is caused by bacteria that enter the skin through cracks or cuts in the skin. The most common types of bacteria that cause cellulitis are staphylococci and streptococci. SIGNS AND SYMPTOMS   Redness and warmth.  Swelling.  Tenderness or pain.  Fever. DIAGNOSIS  Your health care Bethan Adamek can usually determine what is wrong based on a physical exam. Blood tests may also be done. TREATMENT  Treatment usually involves taking an antibiotic medicine. HOME CARE INSTRUCTIONS   Take your antibiotic medicine as directed by your health care Hillarie Harrigan. Finish the antibiotic even if you start to feel better.  Keep the infected arm or leg elevated to reduce swelling.  Apply a warm cloth to the affected area up to 4 times per day to relieve pain.  Take medicines only as directed by your health care Boluwatife Mutchler.  Keep all follow-up visits as directed by your health care Amadeo Coke. SEEK MEDICAL CARE IF:   You notice red streaks coming from the infected area.  Your red area gets larger or turns dark in color.  Your bone or joint underneath the infected area becomes painful after the skin has healed.  Your infection returns in the same area or another area.  You notice a swollen bump in the infected area.  You develop new symptoms.  You have a fever. SEEK IMMEDIATE MEDICAL CARE IF:   You feel very  sleepy.  You develop vomiting or diarrhea.  You have a general ill feeling (malaise) with muscle aches and pains. MAKE SURE YOU:   Understand these instructions.  Will watch your condition.  Will get help right away if you are not doing well or get worse. Document Released: 07/03/2005 Document Revised: 02/07/2014 Document Reviewed: 12/09/2011 [facility] Patient Information 2015 [place], [place]. This information is not intended to replace advice given to you by your health care Tishina Lown. Make sure you discuss any questions you have with your health care Keari Miu.

## 2016-01-01 ENCOUNTER — Ambulatory Visit (INDEPENDENT_AMBULATORY_CARE_PROVIDER_SITE_OTHER): Payer: Federal, State, Local not specified - PPO | Admitting: Internal Medicine

## 2016-01-01 VITALS — BP 120/70 | HR 82 | Temp 99.1°F | Resp 20 | Ht 64.5 in | Wt 180.2 lb

## 2016-01-01 DIAGNOSIS — J020 Streptococcal pharyngitis: Secondary | ICD-10-CM | POA: Diagnosis not present

## 2016-01-01 LAB — POCT RAPID STREP A (OFFICE): RAPID STREP A SCREEN: POSITIVE — AB

## 2016-01-01 MED ORDER — AMOXICILLIN 875 MG PO TABS: 875.0000 mg | ORAL_TABLET | Freq: Two times a day (BID) | ORAL | Status: DC

## 2016-01-01 NOTE — Patient Instructions (Signed)
     IF you received an x-ray today, you will receive an invoice from [facility]. Please contact [facility] at [phone] with questions or concerns regarding your invoice.   IF you received labwork today, you will receive an invoice from [facility]. Please contact [facility] at [phone] with questions or concerns regarding your invoice.   Our billing staff will not be able to assist you with questions regarding bills from these companies.  You will be contacted with the lab results as soon as they are available. The fastest way to get your results is to activate your My Chart account. Instructions are located on the last page of this paperwork. If you have not heard from us regarding the results in 2 weeks, please contact this office.      

## 2016-01-01 NOTE — Progress Notes (Signed)
   Subjective:  By signing my name below, I, Stann Oresung-Kai Tsai, attest that this documentation has been prepared under the direction and in the presence of Ellamae Siaobert Faysal Fenoglio, MD. Electronically Signed: Stann Oresung-Kai Tsai, Scribe. 01/01/2016 , 3:41 PM .  Patient was seen in Room 7 .   Patient ID: Leah Cox Croke, female    DOB: 1991/05/08, 25 y.o.   MRN: [id number] Chief Complaint  Patient presents with  . Sore Throat    x 2 days   HPI Leah Cox Weimann is a 25 y.o. female who [facility] complaining of sore throat that started yesterday. She informs that her coughs started today. She notes that her sore throat exacerbates when she talks. She denies rhinorrhea, trouble swallowing, or myalgia. She denies history of strep infection.   There are no active problems to display for this patient.   Current outpatient prescriptions:  .  doxycycline (VIBRA-TABS) 100 MG tablet, Take 1 tablet (100 mg total) by mouth 2 (two) times daily., Disp: 20 tablet, Rfl: 0 .  ibuprofen (ADVIL,MOTRIN) 800 MG tablet, Take 1 tablet (800 mg total) by mouth 3 (three) times daily., Disp: 30 tablet, Rfl: 11 .  mupirocin ointment (BACTROBAN) 2 %, Apply 1 application topically 3 (three) times daily. For 7 days. (Patient not taking: Reported on 01/01/2016), Disp: 22 g, Rfl: 0  Review of Systems  Constitutional: Positive for fever. Negative for chills, appetite change and fatigue.  HENT: Positive for sore throat. Negative for congestion and rhinorrhea.   Respiratory: Positive for cough. Negative for shortness of breath and wheezing.   Gastrointestinal: Negative for nausea, vomiting and diarrhea.  Musculoskeletal: Negative for myalgias.      Objective:   Physical Exam  Constitutional: She is oriented to person, place, and time. She appears well-developed and well-nourished. No distress.  HENT:  Head: Normocephalic and atraumatic.  Nose: Nose normal.  Mouth/Throat: Oropharyngeal exudate (little) and posterior oropharyngeal  erythema present.  Eyes: EOM are normal. Pupils are equal, round, and reactive to light.  Neck: Neck supple.  Cardiovascular: Normal rate.   Pulmonary/Chest: Effort normal and breath sounds normal. No respiratory distress. She has no decreased breath sounds. She has no wheezes.  Musculoskeletal: Normal range of motion.  Neurological: She is alert and oriented to person, place, and time.  Skin: Skin is warm and dry.  Psychiatric: She has a normal mood and affect. Her behavior is normal.  Nursing note and vitals reviewed.   BP 120/70 mmHg  Pulse 82  Temp(Src) 99.1 F (37.3 C) (Oral)  Resp 20  Ht 5' 4.5" (1.638 m)  Wt 180 lb 3.2 oz (81.738 kg)  BMI 30.46 kg/m2  SpO2 97%  LMP 12/12/2015   Results for orders placed or performed in visit on 01/01/16  POCT rapid strep A  Result Value Ref Range   Rapid Strep A Screen Positive (A) Negative       Assessment & Plan:  Strep pharyngitis - Plan: POCT rapid strep A  Meds ordered this encounter  Medications  . amoxicillin (AMOXIL) 875 MG tablet    Sig: Take 1 tablet (875 mg total) by mouth 2 (two) times daily.    Dispense:  20 tablet    Refill:  0   I have completed the patient encounter in its entirety as documented by the scribe, with editing by me where necessary. Toribio Seiber P. Merla Richesoolittle, M.D.

## 2016-02-08 ENCOUNTER — Ambulatory Visit (INDEPENDENT_AMBULATORY_CARE_PROVIDER_SITE_OTHER): Payer: Federal, State, Local not specified - PPO | Admitting: Family Medicine

## 2016-02-08 VITALS — BP 122/78 | HR 86 | Temp 98.2°F | Resp 16 | Ht 65.0 in | Wt 181.2 lb

## 2016-02-08 DIAGNOSIS — N898 Other specified noninflammatory disorders of vagina: Secondary | ICD-10-CM | POA: Diagnosis not present

## 2016-02-08 DIAGNOSIS — B373 Candidiasis of vulva and vagina: Secondary | ICD-10-CM

## 2016-02-08 DIAGNOSIS — L298 Other pruritus: Secondary | ICD-10-CM | POA: Diagnosis not present

## 2016-02-08 DIAGNOSIS — B3731 Acute candidiasis of vulva and vagina: Secondary | ICD-10-CM

## 2016-02-08 LAB — POCT WET + KOH PREP: TRICH BY WET PREP: ABSENT

## 2016-02-08 MED ORDER — FLUCONAZOLE 150 MG PO TABS: 150.0000 mg | ORAL_TABLET | Freq: Once | ORAL | Status: DC

## 2016-02-08 NOTE — Patient Instructions (Addendum)
   IF you received an x-ray today, you will receive an invoice from [facility]. Please contact [facility] at [phone] with questions or concerns regarding your invoice.   IF you received labwork today, you will receive an invoice from [facility]. Please contact [facility] at [phone] with questions or concerns regarding your invoice.   Our billing staff will not be able to assist you with questions regarding bills from these companies.  You will be contacted with the lab results as soon as they are available. The fastest way to get your results is to activate your My Chart account. Instructions are located on the last page of this paperwork. If you have not heard from us regarding the results in 2 weeks, please contact this office.      Monilial Vaginitis Vaginitis in a soreness, swelling and redness (inflammation) of the vagina and vulva. Monilial vaginitis is not a sexually transmitted infection. CAUSES  Yeast vaginitis is caused by yeast (candida) that is normally found in your vagina. With a yeast infection, the candida has overgrown in number to a point that upsets the chemical balance. SYMPTOMS   White, thick vaginal discharge.  Swelling, itching, redness and irritation of the vagina and possibly the lips of the vagina (vulva).  Burning or painful urination.  Painful intercourse. DIAGNOSIS  Things that may contribute to monilial vaginitis are:  Postmenopausal and virginal states.  Pregnancy.  Infections.  Being tired, sick or stressed, especially if you had monilial vaginitis in the past.  Diabetes. Good control will help lower the chance.  Birth control pills.  Tight fitting garments.  Using bubble bath, feminine sprays, douches or deodorant tampons.  Taking certain medications that kill germs (antibiotics).  Sporadic recurrence can occur if you become ill. TREATMENT  Your caregiver will give you  medication.  There are several kinds of anti monilial vaginal creams and suppositories specific for monilial vaginitis. For recurrent yeast infections, use a suppository or cream in the vagina 2 times a week, or as directed.  Anti-monilial or steroid cream for the itching or irritation of the vulva may also be used. Get your caregiver's permission.  Painting the vagina with methylene blue solution may help if the monilial cream does not work.  Eating yogurt may help prevent monilial vaginitis. HOME CARE INSTRUCTIONS   Finish all medication as prescribed.  Do not have sex until treatment is completed or after your caregiver tells you it is okay.  Take warm sitz baths.  Do not douche.  Do not use tampons, especially scented ones.  Wear cotton underwear.  Avoid tight pants and panty hose.  Tell your sexual partner that you have a yeast infection. They should go to their caregiver if they have symptoms such as mild rash or itching.  Your sexual partner should be treated as well if your infection is difficult to eliminate.  Practice safer sex. Use condoms.  Some vaginal medications cause latex condoms to fail. Vaginal medications that harm condoms are:  Cleocin cream.  Butoconazole (Femstat).  Terconazole (Terazol) vaginal suppository.  Miconazole (Monistat) (may be purchased over the counter). SEEK MEDICAL CARE IF:   You have a temperature by mouth above 102 F (38.9 C).  The infection is getting worse after 2 days of treatment.  The infection is not getting better after 3 days of treatment.  You develop blisters in or around your vagina.  You develop vaginal bleeding, and it is not your menstrual period.  You have   pain when you urinate.  You develop intestinal problems.  You have pain with sexual intercourse.   This information is not intended to replace advice given to you by your health care provider. Make sure you discuss any questions you have with your  health care provider.   Document Released: 07/03/2005 Document Revised: 12/16/2011 Document Reviewed: 03/27/2015 Elsevier Interactive Patient Education 2016 Elsevier Inc.  

## 2016-02-08 NOTE — Progress Notes (Signed)
Subjective:    Patient ID: Leah Cox, female    DOB: August 21, 1991, 25 y.o.   MRN: [id number]  02/08/2016  Vaginal Discharge   HPI This 25 y.o. female presents for evaluation of vaginal discharge.  Onset two days ago.  No fever/cihlls/sweats.  No abdominal pain; no n/v/d/c.  No dysuria, frequency, hematuria, nocturia. No back pain.  +vaginal itching.  Greenish discharge.  No odor.  +sexual activiity; same partner x 2 years.  Female partner.  No STDs.  Appointment with gynecology; has discussed with gynecologist.  Works Clinical biochemist.  OCPs in past; crazy schedule iin past.  Same crazy schedule.  Considering Nexplanon.  Sister with Nexplanon.     Review of Systems  Constitutional: Negative for fever, chills, diaphoresis and fatigue.  Eyes: Negative for visual disturbance.  Respiratory: Negative for cough and shortness of breath.   Cardiovascular: Negative for chest pain, palpitations and leg swelling.  Gastrointestinal: Negative for nausea, vomiting, abdominal pain, diarrhea and constipation.  Endocrine: Negative for cold intolerance, heat intolerance, polydipsia, polyphagia and polyuria.  Genitourinary: Positive for vaginal discharge. Negative for dysuria, frequency, hematuria, flank pain, genital sores and vaginal pain.  Neurological: Negative for dizziness, tremors, seizures, syncope, facial asymmetry, speech difficulty, weakness, light-headedness, numbness and headaches.    Past Medical History  Diagnosis Date  . Allergy     seasonal   History reviewed. No pertinent past surgical history. Allergies  Allergen Reactions  . Codeine Swelling   Current Outpatient Prescriptions  Medication Sig Dispense Refill  . cetirizine (ZYRTEC) 10 MG tablet Take 10 mg by mouth daily.    Marland Kitchen ibuprofen (ADVIL,MOTRIN) 200 MG tablet Take 200 mg by mouth every 6 (six) hours as needed.     No current facility-administered medications for this visit.   Social History   Social History  . Marital  Status: Single    Spouse Name: N/A  . Number of Children: N/A  . Years of Education: N/A   Occupational History  . Not on file.   Social History Main Topics  . Smoking status: Never Smoker   . Smokeless tobacco: Never Used  . Alcohol Use: No  . Drug Use: No  . Sexual Activity: No   Other Topics Concern  . Not on file   Social History Narrative   Family History  Problem Relation Age of Onset  . Hypertension Mother   . Diabetes Sister     Insulin dependent       Objective:    BP 122/78 mmHg  Pulse 86  Temp(Src) 98.2 F (36.8 C) (Oral)  Resp 16  Ht  (1.651 m)  Wt 181 lb 3.2 oz (82.192 kg)  BMI 30.15 kg/m2  SpO2 97%  LMP 01/18/2016 Physical Exam  Constitutional: She is oriented to person, place, and time. She appears well-developed and well-nourished. No distress.  HENT:  Head: Normocephalic and atraumatic.  Right Ear: External ear normal.  Left Ear: External ear normal.  Nose: Nose normal.  Mouth/Throat: Oropharynx is clear and moist.  Eyes: Conjunctivae and EOM are normal. Pupils are equal, round, and reactive to light.  Neck: Normal range of motion. Neck supple. Carotid bruit is not present. No thyromegaly present.  Cardiovascular: Normal rate, regular rhythm, normal heart sounds and intact distal pulses.  Exam reveals no gallop and no friction rub.   No murmur heard. Pulmonary/Chest: Effort normal and breath sounds normal. She has no wheezes. She has no rales.  Abdominal: Soft. Bowel sounds are normal. She  exhibits no distension and no mass. There is no tenderness. There is no rebound and no guarding.  Genitourinary: Uterus normal. There is no rash, tenderness, lesion or injury on the right labia. There is no rash, tenderness, lesion or injury on the left labia. Cervix exhibits no motion tenderness. Right adnexum displays no mass, no tenderness and no fullness. Left adnexum displays no mass, no tenderness and no fullness. No erythema, tenderness or bleeding  in the vagina. No foreign body around the vagina. Vaginal discharge found.  Lymphadenopathy:    She has no cervical adenopathy.  Neurological: She is alert and oriented to person, place, and time. No cranial nerve deficit.  Skin: Skin is warm and dry. No rash noted. She is not diaphoretic. No erythema. No pallor.  Psychiatric: She has a normal mood and affect. Her behavior is normal.   Results for orders placed or performed in visit on 02/08/16  GC/Chlamydia Probe Amp  Result Value Ref Range   CT Probe RNA NOT DETECTED    GC Probe RNA NOT DETECTED   POCT Wet + KOH Prep  Result Value Ref Range   Yeast by KOH Present Present, Absent   Yeast by wet prep Present Present, Absent   WBC by wet prep Few None, Few, Too numerous to count   Clue Cells Wet Prep HPF POC Few (A) None, Too numerous to count   Trich by wet prep Absent Present, Absent   Bacteria Wet Prep HPF POC Many (A) None, Few, Too numerous to count   Epithelial Cells By [employer] FinancialWet Pref ([facility]) Many (A) None, Few, Too numerous to count   RBC,UR,HPF,POC None None RBC/hpf       Assessment & Plan:   1. Candidiasis of genitalia in female   2. Vaginal discharge   3. Vaginal itching    -New. -Rx for Diflucan provided.     Orders Placed This Encounter  Procedures  . GC/Chlamydia Probe Amp    Order Specific Question:  Source    Answer:  cervical  . POCT Wet + KOH Prep   Meds ordered this encounter  Medications  . cetirizine (ZYRTEC) 10 MG tablet    Sig: Take 10 mg by mouth daily.  Marland Kitchen. DISCONTD: fluconazole (DIFLUCAN) 150 MG tablet    Sig: Take 1 tablet (150 mg total) by mouth once. Repeat if needed    Dispense:  2 tablet    Refill:  0    No Follow-up on file.    Quisha Mabie Paulita FujitaMartin Harbor Paster, M.D. [facility]  [place] [address] [place], [place]  [id number] [phone] phone [phone] fax

## 2016-02-09 LAB — GC/CHLAMYDIA PROBE AMP
CT PROBE, AMP APTIMA: NOT DETECTED
GC PROBE AMP APTIMA: NOT DETECTED

## 2016-03-02 ENCOUNTER — Ambulatory Visit (INDEPENDENT_AMBULATORY_CARE_PROVIDER_SITE_OTHER): Payer: Federal, State, Local not specified - PPO | Admitting: Physician Assistant

## 2016-03-02 ENCOUNTER — Ambulatory Visit (INDEPENDENT_AMBULATORY_CARE_PROVIDER_SITE_OTHER): Payer: Federal, State, Local not specified - PPO

## 2016-03-02 VITALS — BP 112/60 | HR 77 | Temp 98.1°F | Resp 18 | Ht 65.0 in | Wt 181.2 lb

## 2016-03-02 DIAGNOSIS — M25572 Pain in left ankle and joints of left foot: Secondary | ICD-10-CM

## 2016-03-02 DIAGNOSIS — S93402A Sprain of unspecified ligament of left ankle, initial encounter: Secondary | ICD-10-CM | POA: Diagnosis not present

## 2016-03-02 DIAGNOSIS — T148XXA Other injury of unspecified body region, initial encounter: Secondary | ICD-10-CM

## 2016-03-02 DIAGNOSIS — T148 Other injury of unspecified body region: Secondary | ICD-10-CM | POA: Diagnosis not present

## 2016-03-02 NOTE — Patient Instructions (Addendum)
IF you received an x-ray today, you will receive an invoice from [facility]. Please contact [facility] at [phone] with questions or concerns regarding your invoice.   IF you received labwork today, you will receive an invoice from [employer]. Please contact [facility] at [phone] with questions or concerns regarding your invoice.   Our billing staff will not be able to assist you with questions regarding bills from these companies.  You will be contacted with the lab results as soon as they are available. The fastest way to get your results is to activate your My Chart account. Instructions are located on the last page of this paperwork. If you have not heard from [place] regarding the results in 2 weeks, please contact this office.    You may do the  every 8 hours. I would like you to ice the ankle three times per day. I would like you to stretch the ankle three times per day.  Pick three pictures and each one three times per day.  Ice directly after.  Elevate the leg as much as possible for the next 48 hours. Hydrate and eat well with vegetables.   Acute Ankle Sprain With Phase I Rehab An acute ankle sprain is a partial or complete tear in one or more of the ligaments of the ankle due to traumatic injury. The severity of the injury depends on both the number of ligaments sprained and the grade of sprain. There are 3 grades of sprains.   A grade 1 sprain is a mild sprain. There is a slight pull without obvious tearing. There is no loss of strength, and the muscle and ligament are the correct length.  A grade 2 sprain is a moderate sprain. There is tearing of fibers within the substance of the ligament where it connects two bones or two cartilages. The length of the ligament is increased, and there is usually decreased strength.  A grade 3 sprain is a complete rupture of the ligament and is uncommon. In addition to the grade of  sprain, there are three types of ankle sprains.  Lateral ankle sprains: This is a sprain of one or more of the three ligaments on the outer side (lateral) of the ankle. These are the most common sprains. Medial ankle sprains: There is one large triangular ligament of the inner side (medial) of the ankle that is susceptible to injury. Medial ankle sprains are less common. Syndesmosis, "high ankle," sprains: The syndesmosis is the ligament that connects the two bones of the lower leg. Syndesmosis sprains usually only occur with very severe ankle sprains. SYMPTOMS  Pain, tenderness, and swelling in the ankle, starting at the side of injury that may progress to the whole ankle and foot with time.  "Pop" or tearing sensation at the time of injury.  Bruising that may spread to the heel.  Impaired ability to walk soon after injury. CAUSES   Acute ankle sprains are caused by trauma placed on the ankle that temporarily forces or pries the anklebone (talus) out of its normal socket.  Stretching or tearing of the ligaments that normally hold the joint in place (usually due to a twisting injury). RISK INCREASES WITH:  Previous ankle sprain.  Sports in which the foot may land awkwardly (i.e., basketball, volleyball, or soccer) or walking or running on uneven or rough surfaces.  Shoes with inadequate support to prevent sideways motion when stress occurs.  Poor strength and flexibility.  Poor balance skills.  Contact sports. PREVENTION   Warm up and stretch properly before activity.  Maintain physical fitness:  Ankle and leg flexibility, muscle strength, and endurance.  Cardiovascular fitness.  Balance training activities.  Use proper technique and have a coach correct improper technique.  Taping, protective strapping, bracing, or high-top tennis shoes may help prevent injury. Initially, tape is best; however, it loses most of its support function within 10 to 15 minutes.  Wear  proper-fitted protective shoes (High-top shoes with taping or bracing is more effective than either alone).  Provide the ankle with support during sports and practice activities for 12 months following injury. PROGNOSIS   If treated properly, ankle sprains can be expected to recover completely; however, the length of recovery depends on the degree of injury.  A grade 1 sprain usually heals enough in 5 to 7 days to allow modified activity and requires an average of 6 weeks to heal completely.  A grade 2 sprain requires 6 to 10 weeks to heal completely.  A grade 3 sprain requires 12 to 16 weeks to heal.  A syndesmosis sprain often takes more than 3 months to heal. RELATED COMPLICATIONS   Frequent recurrence of symptoms may result in a chronic problem. Appropriately addressing the problem the first time decreases the frequency of recurrence and optimizes healing time. Severity of the initial sprain does not predict the likelihood of later instability.  Injury to other structures (bone, cartilage, or tendon).  A chronically unstable or arthritic ankle joint is a possibility with repeated sprains. TREATMENT Treatment initially involves the use of ice, medication, and compression bandages to help reduce pain and inflammation. Ankle sprains are usually immobilized in a walking cast or boot to allow for healing. Crutches may be recommended to reduce pressure on the injury. After immobilization, strengthening and stretching exercises may be necessary to regain strength and a full range of motion. Surgery is rarely needed to treat ankle sprains. MEDICATION   Nonsteroidal anti-inflammatory medications, such as aspirin and ibuprofen (do not take for the first 3 days after injury or within 7 days before surgery), or other minor pain relievers, such as acetaminophen, are often recommended. Take these as directed by your caregiver. Contact your caregiver immediately if any bleeding, stomach upset, or signs  of an allergic reaction occur from these medications.  Ointments applied to the skin may be helpful.  Pain relievers may be prescribed as necessary by your caregiver. Do not take prescription pain medication for longer than 4 to 7 days. Use only as directed and only as much as you need. HEAT AND COLD  Cold treatment (icing) is used to relieve pain and reduce inflammation for acute and chronic cases. Cold should be applied for 10 to 15 minutes every 2 to 3 hours for inflammation and pain and immediately after any activity that aggravates your symptoms. Use ice packs or an ice massage.  Heat treatment may be used before performing stretching and strengthening activities prescribed by your caregiver. Use a heat pack or a warm soak. SEEK IMMEDIATE MEDICAL CARE IF:   Pain, swelling, or bruising worsens despite treatment.  You experience pain, numbness, discoloration, or coldness in the foot or toes.  New, unexplained symptoms develop (drugs used in treatment may produce side effects.) EXERCISES  PHASE I EXERCISES RANGE OF MOTION (ROM) AND STRETCHING EXERCISES - Ankle Sprain, Acute Phase I, Weeks 1 to 2 These exercises may help you when beginning to restore flexibility in your ankle. You will likely work on these exercises  for the 1 to 2 weeks after your injury. Once your physician, physical therapist, or athletic trainer sees adequate progress, he or she will advance your exercises. While completing these exercises, remember:   Restoring tissue flexibility helps normal motion to return to the joints. This allows healthier, less painful movement and activity.  An effective stretch should be held for at least 30 seconds.  A stretch should never be painful. You should only feel a gentle lengthening or release in the stretched tissue. RANGE OF MOTION - Dorsi/Plantar Flexion  While sitting with your right / left knee straight, draw the top of your foot upwards by flexing your ankle. Then reverse  the motion, pointing your toes downward.  Hold each position for __________ seconds.  After completing your first set of exercises, repeat this exercise with your knee bent. Repeat __________ times. Complete this exercise __________ times per day.  RANGE OF MOTION - Ankle Alphabet  Imagine your right / left big toe is a pen.  Keeping your hip and knee still, write out the entire alphabet with your "pen." Make the letters as large as you can without increasing any discomfort. Repeat __________ times. Complete this exercise __________ times per day.  STRENGTHENING EXERCISES - Ankle Sprain, Acute -Phase I, Weeks 1 to 2 These exercises may help you when beginning to restore strength in your ankle. You will likely work on these exercises for 1 to 2 weeks after your injury. Once your physician, physical therapist, or athletic trainer sees adequate progress, he or she will advance your exercises. While completing these exercises, remember:   Muscles can gain both the endurance and the strength needed for everyday activities through controlled exercises.  Complete these exercises as instructed by your physician, physical therapist, or athletic trainer. Progress the resistance and repetitions only as guided.  You may experience muscle soreness or fatigue, but the pain or discomfort you are trying to eliminate should never worsen during these exercises. If this pain does worsen, stop and make certain you are following the directions exactly. If the pain is still present after adjustments, discontinue the exercise until you can discuss the trouble with your clinician. STRENGTH - Dorsiflexors  Secure a rubber exercise band/tubing to a fixed object (i.e., table, pole) and loop the other end around your right / left foot.  Sit on the floor facing the fixed object. The band/tubing should be slightly tense when your foot is relaxed.  Slowly draw your foot back toward you using your ankle and toes.  Hold  this position for __________ seconds. Slowly release the tension in the band and return your foot to the starting position. Repeat __________ times. Complete this exercise __________ times per day.  STRENGTH - Plantar-flexors   Sit with your right / left leg extended. Holding onto both ends of a rubber exercise band/tubing, loop it around the ball of your foot. Keep a slight tension in the band.  Slowly push your toes away from you, pointing them downward.  Hold this position for __________ seconds. Return slowly, controlling the tension in the band/tubing. Repeat __________ times. Complete this exercise __________ times per day.  STRENGTH - Ankle Eversion  Secure one end of a rubber exercise band/tubing to a fixed object (table, pole). Loop the other end around your foot just before your toes.  Place your fists between your knees. This will focus your strengthening at your ankle.  Drawing the band/tubing across your opposite foot, slowly, pull your little toe out and up.  Make sure the band/tubing is positioned to resist the entire motion.  Hold this position for __________ seconds. Have your muscles resist the band/tubing as it slowly pulls your foot back to the starting position.  Repeat __________ times. Complete this exercise __________ times per day.  STRENGTH - Ankle Inversion  Secure one end of a rubber exercise band/tubing to a fixed object (table, pole). Loop the other end around your foot just before your toes.  Place your fists between your knees. This will focus your strengthening at your ankle.  Slowly, pull your big toe up and in, making sure the band/tubing is positioned to resist the entire motion.  Hold this position for __________ seconds.  Have your muscles resist the band/tubing as it slowly pulls your foot back to the starting position. Repeat __________ times. Complete this exercises __________ times per day.  STRENGTH - Towel Curls  Sit in a chair positioned  on a non-carpeted surface.  Place your right / left foot on a towel, keeping your heel on the floor.  Pull the towel toward your heel by only curling your toes. Keep your heel on the floor.  If instructed by your physician, physical therapist, or athletic trainer, add weight to the end of the towel. Repeat __________ times. Complete this exercise __________ times per day.   This information is not intended to replace advice given to you by your health care provider. Make sure you discuss any questions you have with your health care provider.   Document Released: 04/24/2005 Document Revised: 10/14/2014 Document Reviewed: 01/05/2009 Elsevier Interactive Patient Education [employer]

## 2016-03-02 NOTE — Progress Notes (Signed)
[facility] [address] [place] [address] [phone]  Date:  03/02/2016   Name:  Leah Cox   DOB:  1991/02/10   MRN:  [id number]  PCP:  No PCP Per Patient   Chief Complaint  Patient presents with  . Leg Injury    left leg-happened Wednesday (fell getting out of the shower)   History of Present Illness:  Leah Cox is a 25 y.o. female patient who presents to [facility] for cc of left leg pain.   --patient reports that leg pain started 3 days ago, after she felt lightheaded in the shower, and fell out while trying to exit.  She hit her left shin on the shower and fell back and hit her head.   --She had no loc.  No head pain.  The day later, her left leg and ankle became painful and swollen.  She could barely bare any weight on the ankle.  Pain is mid shin at the abrasion and extends down.  She also has pain of her left lateral side of ankle.  She has no numbness or tingling.  No weakness or erythema.      There are no active problems to display for this patient.   Past Medical History  Diagnosis Date  . Allergy     seasonal    No past surgical history on file.  Social History  Substance Use Topics  . Smoking status: Never Smoker   . Smokeless tobacco: Never Used  . Alcohol Use: No    Family History  Problem Relation Age of Onset  . Hypertension Mother   . Diabetes Sister     Insulin dependent    Allergies  Allergen Reactions  . Codeine Swelling    Medication list has been reviewed and updated.  Current Outpatient Prescriptions on File Prior to Visit  Medication Sig Dispense Refill  . cetirizine (ZYRTEC) 10 MG tablet Take 10 mg by mouth daily.     No current facility-administered medications on file prior to visit.    ROS ROS otherwise unremarkable unless listed above.   Physical Examination: BP 112/60 mmHg  Pulse 77  Temp(Src) 98.1 F (36.7 C) (Oral)  Resp 18  Ht  (1.651 m)  Wt 181 lb 4 oz (82.214 kg)  BMI 30.16  kg/m2  SpO2 99%  LMP 02/17/2016 (Approximate) Ideal Body Weight: Weight in (lb) to have BMI = 25: 149.9  Physical Exam  Constitutional: She is oriented to person, place, and time. She appears well-developed and well-nourished. No distress.  HENT:  Head: Normocephalic and atraumatic.  Right Ear: External ear normal.  Left Ear: External ear normal.  Eyes: Conjunctivae and EOM are normal. Pupils are equal, round, and reactive to light.  Cardiovascular: Normal rate.   Pulmonary/Chest: Effort normal. No respiratory distress.  Musculoskeletal:       Left ankle: She exhibits normal range of motion and no swelling.  Abrasion at the anterior of the left tibia.  Tender to the touch.  Minimal ecchymosis in that area.  Pain with passive dorsiflexion at the lateral malleolus.  No tenderness along the malleolus with palpation.  Pain with inversion.   Neurological: She is alert and oriented to person, place, and time.  Skin: She is not diaphoretic.  Psychiatric: She has a normal mood and affect. Her behavior is normal.     Assessment and Plan: Leah Cox is a 25 y.o. female who is here today for left  leg pain. Appears to be contusion and possible ankle sprain. I have advised ice, nsaids and stretches at this time.  She was given sweedo brace today, but advised to attempt to come out of this asap.  Patient voiced understanding.  rtc in 7 days if no improvmement.   Pain in joint, ankle and foot, left - Plan: DG Ankle Complete Left, DG Tibia/Fibula Left  Sprain of ankle, left, initial encounter  Contusion  Trena PlattStephanie Dauntae Derusha, PA-C [facility] 03/02/2016 10:52 AM

## 2016-03-11 ENCOUNTER — Ambulatory Visit (HOSPITAL_COMMUNITY)
Admission: EM | Admit: 2016-03-11 | Discharge: 2016-03-11 | Disposition: A | Discharge status: Home / Self Care | Payer: Federal, State, Local not specified - PPO | Attending: Emergency Medicine | Admitting: Emergency Medicine

## 2016-03-11 ENCOUNTER — Encounter (HOSPITAL_COMMUNITY): Payer: Self-pay | Admitting: Emergency Medicine

## 2016-03-11 DIAGNOSIS — M25512 Pain in left shoulder: Secondary | ICD-10-CM | POA: Diagnosis not present

## 2016-03-11 DIAGNOSIS — M545 Low back pain, unspecified: Secondary | ICD-10-CM

## 2016-03-11 MED ORDER — CYCLOBENZAPRINE HCL 5 MG PO TABS: 5.0000 mg | ORAL_TABLET | Freq: Three times a day (TID) | ORAL | Status: DC | PRN
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
## 2016-03-14 ENCOUNTER — Telehealth (HOSPITAL_COMMUNITY): Payer: Self-pay | Admitting: Emergency Medicine

## 2016-03-14 NOTE — ED Notes (Signed)
Pt is here to get note for work... Seen here on 6/5.... Reports she was sent home from work and needs extension until 6/12... Notified pt that I can not give her a note for 7 days and the most I can do is x2 days... Pt verb understanding.

## 2018-12-04 ENCOUNTER — Ambulatory Visit (HOSPITAL_COMMUNITY)
Admission: EM | Admit: 2018-12-04 | Discharge: 2018-12-04 | Disposition: A | Discharge status: Home / Self Care | Payer: Federal, State, Local not specified - PPO | Attending: Family Medicine | Admitting: Family Medicine

## 2018-12-04 ENCOUNTER — Encounter (HOSPITAL_COMMUNITY): Payer: Self-pay

## 2018-12-04 DIAGNOSIS — N12 Tubulo-interstitial nephritis, not specified as acute or chronic: Secondary | ICD-10-CM | POA: Diagnosis not present

## 2018-12-04 LAB — POCT URINALYSIS DIP (DEVICE)
Glucose, UA: NEGATIVE mg/dL
Ketones, ur: NEGATIVE mg/dL
NITRITE: POSITIVE — AB
PH: 5.5 (ref 5.0–8.0)
Protein, ur: >=300 mg/dL — AB
SPECIFIC GRAVITY, URINE: 1.02 (ref 1.005–1.030)
Urobilinogen, UA: >=8 mg/dL (ref 0.0–1.0)

## 2018-12-04 MED ORDER — SULFAMETHOXAZOLE-TRIMETHOPRIM 800-160 MG PO TABS: 1.0000 | ORAL_TABLET | Freq: Two times a day (BID) | ORAL | 0 refills | Status: AC

## 2018-12-04 MED ORDER — ONDANSETRON 4 MG PO TBDP
ORAL_TABLET | ORAL | Status: AC
  Filled 2018-12-04: qty 1

## 2018-12-04 MED ORDER — IBUPROFEN 800 MG PO TABS
ORAL_TABLET | ORAL | Status: AC
  Filled 2018-12-04: qty 1

## 2018-12-04 MED ORDER — CEFTRIAXONE SODIUM 1 G IJ SOLR
1.0000 g | Freq: Once | INTRAMUSCULAR | Status: AC
  Administered 2018-12-04: 1 g via INTRAMUSCULAR

## 2018-12-04 MED ORDER — ONDANSETRON 4 MG PO TBDP: 4.0000 mg | ORAL_TABLET | Freq: Three times a day (TID) | ORAL | 0 refills | Status: DC | PRN

## 2018-12-04 MED ORDER — ONDANSETRON 4 MG PO TBDP
4.0000 mg | ORAL_TABLET | Freq: Once | ORAL | Status: AC
  Administered 2018-12-04: 4 mg via ORAL

## 2018-12-04 MED ORDER — CEFTRIAXONE SODIUM 1 G IJ SOLR
INTRAMUSCULAR | Status: AC
  Filled 2018-12-04: qty 10

## 2018-12-04 MED ORDER — IBUPROFEN 800 MG PO TABS: 800.0000 mg | ORAL_TABLET | Freq: Three times a day (TID) | ORAL | 0 refills | Status: DC

## 2018-12-04 MED ORDER — IBUPROFEN 800 MG PO TABS
800.0000 mg | ORAL_TABLET | Freq: Once | ORAL | Status: AC
  Administered 2018-12-04: 800 mg via ORAL

## 2018-12-04 NOTE — ED Triage Notes (Signed)
Pt presents with chills, vomiting, general body aches, and fatigue X 2 days.  Pt also has complaints of urinary tract symptoms; burning with urination and urinary frequency X 1 week.

## 2018-12-04 NOTE — Discharge Instructions (Signed)
Complete course of antibiotics.  Drink plenty of fluids to ensure adequate hydration.  Tylenol and/or ibuprofen as needed for pain or fevers.   If symptoms worsen or do not improve in the next week to return to be seen or to follow up with your PCP.

## 2018-12-04 NOTE — ED Provider Notes (Addendum)
[facility]    CSN: [id number] Arrival date & time: 12/04/18  4098     History   Chief Complaint Chief Complaint  Patient presents with  . URI  . Urinary Tract Infection    HPI Leah Cox is a 28 y.o. female.   Jatziry presents with complaints of urinary symptoms for the past week. Pain and frequency with urination. Today noted blood to urine. For the past two days has felt feverish, weakness and body aches. Some congestion today. Otherwise no cough, ear pain or sore throat. Intermittent right flank pain. Generalized pain 6/10. Tried taking alka selzer cold which hasn't helped. Took ibuprofen yesterday which didn't help. No known ill contacts. Denies vaginal symptoms. LMP last week. No abdominal or pelvic pain. 1 episode of emesis this morning with mild nausea. Without contributing medical history.      ROS per HPI.      Past Medical History:  Diagnosis Date  . Allergy    seasonal    There are no active problems to display for this patient.   History reviewed. No pertinent surgical history.  OB History   No obstetric history on file.      Home Medications    Prior to Admission medications   Medication Sig Start Date End Date Taking? Authorizing Provider  cetirizine (ZYRTEC) 10 MG tablet Take 10 mg by mouth daily.    [provider]  cyclobenzaprine (FLEXERIL) 5 MG tablet Take 1 tablet (5 mg total) by mouth 3 (three) times daily as needed for muscle spasms. 03/11/16   Charm Rings, MD  ibuprofen (ADVIL,MOTRIN) 800 MG tablet Take 1 tablet (800 mg total) by mouth 3 (three) times daily. 12/04/18   Georgetta Haber, NP  ondansetron (ZOFRAN-ODT) 4 MG disintegrating tablet Take 1 tablet (4 mg total) by mouth every 8 (eight) hours as needed for nausea or vomiting. 12/04/18   Georgetta Haber, NP  sulfamethoxazole-trimethoprim (BACTRIM DS) 800-160 MG tablet Take 1 tablet by mouth 2 (two) times daily for 14 days. 12/04/18 12/18/18  Georgetta Haber, NP     Family History Family History  Problem Relation Age of Onset  . Hypertension Mother   . Diabetes Sister        Insulin dependent    Social History Social History   Tobacco Use  . Smoking status: Never Smoker  . Smokeless tobacco: Never Used  Substance Use Topics  . Alcohol use: No  . Drug use: No     Allergies   Codeine and Latex   Review of Systems Review of Systems   Physical Exam Triage Vital Signs ED Triage Vitals [12/04/18 0921]  Enc Vitals Group     BP (!) 147/90     Pulse Rate (!) 128     Resp 16     Temp 100.1 F (37.8 C)     Temp Source Oral     SpO2 100 %     Weight      Height      Head Circumference      Peak Flow      Pain Score 6     Pain Loc      Pain Edu?      Excl. in [place]?    No data found.  Updated Vital Signs BP (!) 147/90 (BP Location: Right Arm)   Pulse (!) 128   Temp 100.1 F (37.8 C) (Oral)   Resp 16   LMP 11/27/2018   SpO2  100%    Physical Exam Constitutional:      General: She is not in acute distress.    Appearance: She is well-developed.  HENT:     Head: Normocephalic and atraumatic.     Right Ear: Tympanic membrane, ear canal and external ear normal.     Left Ear: Tympanic membrane, ear canal and external ear normal.     Nose: Nose normal.     Mouth/Throat:     Pharynx: Uvula midline.     Tonsils: No tonsillar exudate.  Eyes:     Conjunctiva/sclera: Conjunctivae normal.     Pupils: Pupils are equal, round, and reactive to light.  Cardiovascular:     Rate and Rhythm: Regular rhythm. Tachycardia present.     Heart sounds: Normal heart sounds.  Pulmonary:     Effort: Pulmonary effort is normal.     Breath sounds: Normal breath sounds.  Abdominal:     Palpations: There is no mass.     Tenderness: There is no abdominal tenderness. There is no right CVA tenderness or left CVA tenderness.  Skin:    General: Skin is warm and dry.  Neurological:     Mental Status: She is alert and oriented to person, place,  and time.      UC Treatments / Results  Labs (all labs ordered are listed, but only abnormal results are displayed) Labs Reviewed  POCT URINALYSIS DIP (DEVICE) - Abnormal; Notable for the following components:      Result Value   Bilirubin Urine SMALL (*)    Hgb urine dipstick LARGE (*)    Protein, ur >=300 (*)    Nitrite POSITIVE (*)    Leukocytes,Ua LARGE (*)    All other components within normal limits  URINE CULTURE    EKG None  Radiology No results found.  Procedures Procedures (including critical care time)  Medications Ordered in UC Medications  cefTRIAXone (ROCEPHIN) injection 1 g (1 g Intramuscular Given 12/04/18 1035)  ondansetron (ZOFRAN-ODT) disintegrating tablet 4 mg (4 mg Oral Given 12/04/18 1035)  ibuprofen (ADVIL,MOTRIN) tablet 800 mg (800 mg Oral Given 12/04/18 1036)    Initial Impression / Assessment and Plan / UC Course  I have reviewed the triage vital signs and the nursing notes.  Pertinent labs & imaging results that were available during my care of the patient were reviewed by me and considered in my medical decision making (see chart for details).     Febrile, tachycardic, with urine concerning for infection. Minimal URI symptoms and benign physical exam. Will cover for pyelo, rocephin provided in clinic as patient did vomit this morning. zofran provided. Return precautions provided. If symptoms worsen or do not improve in the next week to return to be seen or to follow up with PCP.  Patient verbalized understanding and agreeable to plan.   Final Clinical Impressions(s) / UC Diagnoses   Final diagnoses:  Pyelonephritis     Discharge Instructions     Complete course of antibiotics.  Drink plenty of fluids to ensure adequate hydration.  Tylenol and/or ibuprofen as needed for pain or fevers.   If symptoms worsen or do not improve in the next week to return to be seen or to follow up with your PCP.      ED Prescriptions    Medication Sig  Dispense Auth. Provider   ondansetron (ZOFRAN-ODT) 4 MG disintegrating tablet Take 1 tablet (4 mg total) by mouth every 8 (eight) hours as needed for nausea or vomiting. 12  tablet Linus Mako B, NP   ibuprofen (ADVIL,MOTRIN) 800 MG tablet Take 1 tablet (800 mg total) by mouth 3 (three) times daily. 21 tablet Linus Mako B, NP   sulfamethoxazole-trimethoprim (BACTRIM DS) 800-160 MG tablet Take 1 tablet by mouth 2 (two) times daily for 14 days. 28 tablet Georgetta Haber, NP     Controlled Substance Prescriptions [place] Controlled Substance Registry consulted? Not Applicable   Georgetta Haber, NP 12/04/18 1023    Linus Mako B, NP 12/04/18 1114

## 2018-12-06 LAB — URINE CULTURE: Culture: >=100000 — AB

## 2018-12-07 ENCOUNTER — Telehealth (HOSPITAL_COMMUNITY): Payer: Self-pay | Admitting: Emergency Medicine

## 2018-12-07 NOTE — Telephone Encounter (Signed)
Urine culture was positive for e coli and was given bactrim  at urgent care visit.. Attempted to reach patient. No answer at this time.   

## 2019-01-20 LAB — OB RESULTS CONSOLE ANTIBODY SCREEN: Antibody Screen: NEGATIVE

## 2019-01-20 LAB — OB RESULTS CONSOLE ABO/RH: RH Type: POSITIVE

## 2019-01-20 LAB — OB RESULTS CONSOLE RUBELLA ANTIBODY, IGM: Rubella: IMMUNE

## 2019-01-20 LAB — OB RESULTS CONSOLE HEPATITIS B SURFACE ANTIGEN: Hepatitis B Surface Ag: NEGATIVE

## 2019-01-20 LAB — OB RESULTS CONSOLE GC/CHLAMYDIA
Chlamydia: NEGATIVE
Gonorrhea: NEGATIVE

## 2019-01-20 LAB — OB RESULTS CONSOLE RPR: RPR: NONREACTIVE

## 2019-01-20 LAB — OB RESULTS CONSOLE HIV ANTIBODY (ROUTINE TESTING): HIV: NONREACTIVE

## 2019-07-19 ENCOUNTER — Other Ambulatory Visit: Payer: Self-pay

## 2019-07-19 ENCOUNTER — Encounter: Payer: Self-pay | Admitting: Emergency Medicine

## 2019-07-19 ENCOUNTER — Ambulatory Visit
Admission: EM | Admit: 2019-07-19 | Discharge: 2019-07-19 | Disposition: A | Discharge status: Home / Self Care | Payer: Federal, State, Local not specified - PPO | Attending: Emergency Medicine | Admitting: Emergency Medicine

## 2019-07-19 DIAGNOSIS — L03011 Cellulitis of right finger: Secondary | ICD-10-CM | POA: Diagnosis not present

## 2019-07-19 NOTE — ED Notes (Signed)
Patient able to ambulate independently  

## 2019-07-19 NOTE — ED Triage Notes (Signed)
Pt presents to [facility] for swelling, discharge and pain to her right pinky finger x 2 days, with some sensitivity x 3 days.

## 2019-07-19 NOTE — ED Provider Notes (Signed)
[name]    CSN: [id number] Arrival date & time: 07/19/19  1745      History   Chief Complaint Chief Complaint  Patient presents with  . Hand Pain    HPI Leah Cox is a 28 y.o. female presenting for right fifth digit paronychia since 2 days ago.  Patient states she has had this before and her toe "years ago ".  Has tried warm soaks with some relief.  States it busted a little bit on its own the other day.  Came in today for I&D which she is tolerated well in the past.  No anticoagulant or blood thinner use.  Patient currently 8 months gestation without reported complication.   Past Medical History:  Diagnosis Date  . Allergy    seasonal    There are no active problems to display for this patient.   History reviewed. No pertinent surgical history.  OB History    Gravida  1   Para      Term      Preterm      AB      Living        SAB      TAB      Ectopic      Multiple      Live Births               Home Medications    Prior to Admission medications   Medication Sig Start Date End Date Taking? Authorizing Provider  cetirizine (ZYRTEC) 10 MG tablet Take 10 mg by mouth daily.    [provider]  ibuprofen (ADVIL,MOTRIN) 800 MG tablet Take 1 tablet (800 mg total) by mouth 3 (three) times daily. 12/04/18   Zigmund Gottron, NP    Family History Family History  Problem Relation Age of Onset  . Hypertension Mother   . Diabetes Sister        Insulin dependent    Social History Social History   Tobacco Use  . Smoking status: Never Smoker  . Smokeless tobacco: Never Used  Substance Use Topics  . Alcohol use: No  . Drug use: No     Allergies   Codeine, Latex, and Nickel   Review of Systems Review of Systems  Constitutional: Negative for fatigue and fever.  HENT: Negative for ear pain, sinus pain, sore throat and voice change.   Eyes: Negative for pain, redness and visual disturbance.  Respiratory:  Negative for cough and shortness of breath.   Cardiovascular: Negative for chest pain and palpitations.  Gastrointestinal: Negative for abdominal pain, diarrhea and vomiting.  Musculoskeletal: Negative for arthralgias and myalgias.  Skin: Positive for wound. Negative for rash.  Neurological: Negative for syncope and headaches.     Physical Exam Triage Vital Signs ED Triage Vitals  Enc Vitals Group     BP      Pulse      Resp      Temp      Temp src      SpO2      Weight      Height      Head Circumference      Peak Flow      Pain Score      Pain Loc      Pain Edu?      Excl. in [place]?    No data found.  Updated Vital Signs BP 123/84 (BP Location: Left Arm)   Pulse 97  Temp 98.1 F (36.7 C) (Temporal)   Resp 18   LMP 11/27/2018   SpO2 98%   Visual Acuity Right Eye Distance:   Left Eye Distance:   Bilateral Distance:    Right Eye Near:   Left Eye Near:    Bilateral Near:     Physical Exam Constitutional:      General: She is not in acute distress. HENT:     Head: Normocephalic and atraumatic.  Eyes:     General: No scleral icterus.    Pupils: Pupils are equal, round, and reactive to light.  Cardiovascular:     Rate and Rhythm: Normal rate.  Pulmonary:     Effort: Pulmonary effort is normal.  Musculoskeletal: Normal range of motion.  Skin:    Capillary Refill: Capillary refill takes less than 2 seconds.     Coloration: Skin is not jaundiced or pale.     Comments: Lateral aspect of nail bed of right fifth digit swollen, erythematous, tender with white discoloration.  Neurological:     Mental Status: She is alert and oriented to person, place, and time.      UC Treatments / Results  Labs (all labs ordered are listed, but only abnormal results are displayed) Labs Reviewed - No data to display  EKG   Radiology No results found.  Procedures Incision and Drainage  Date/Time: 07/19/2019 6:10 PM Performed by: Shea Evans, PA-C  Authorized by: Shea Evans, PA-C   Consent:    Consent obtained:  Verbal   Consent given by:  Patient   Risks discussed:  Bleeding, incomplete drainage, pain, damage to other organs and infection   Alternatives discussed:  No treatment and alternative treatment Universal protocol:    Patient identity confirmed:  Verbally with patient Location:    Type:  Abscess   Location:  Upper extremity   Upper extremity location:  Finger   Finger location:  R small finger Pre-procedure details:    Procedure prep: sterile alcohol pad. Anesthesia (see MAR for exact dosages):    Anesthesia method:  Nerve block   Block location:  Base of right fifth digit   Block needle gauge:  25 G   Block anesthetic:  Lidocaine 2% w/o epi   Block technique:  Digital   Block injection procedure:  Anatomic landmarks identified, anatomic landmarks palpated, introduced needle, negative aspiration for blood and incremental injection   Block outcome:  Anesthesia achieved Procedure type:    Complexity:  Simple Procedure details:    Needle aspiration: no     Incision types:  Stab incision   Incision depth:  Subcutaneous   Scalpel blade:  11   Drainage:  Purulent and bloody   Drainage amount:  Copious   Wound treatment:  Wound left open Post-procedure details:    Patient tolerance of procedure:  Tolerated well, no immediate complications   (including critical care time)  Medications Ordered in UC Medications - No data to display  Initial Impression / Assessment and Plan / UC Course  I have reviewed the triage vital signs and the nursing notes.  Pertinent labs & imaging results that were available during my care of the patient were reviewed by me and considered in my medical decision making (see chart for details).     Successful I&D of right fifth digit paronychia.  Patient is not immunocompromise, no signs/symptoms of regional/systemic infection -antibiotic deferred.  Return precautions  discussed, patient verbalized understanding and is agreeable to plan. Final Clinical Impressions(s) / UC Diagnoses  Final diagnoses:  Paronychia of right little finger     Discharge Instructions     Keep the area clean and dry. Use hot compresses or warm water soaks (do not need epsom salt as this may burn) for 5 minutes 3-4 times a day. Return for worsening pain, swelling, prolonged bleeding, pus buildup.    ED Prescriptions    None     PDMP not reviewed this encounter.   Hall-Potvin, [place], [place] JerseyPA-C 07/19/19 1815

## 2019-07-19 NOTE — Discharge Instructions (Addendum)
Keep the area clean and dry. Use hot compresses or warm water soaks (do not need epsom salt as this may burn) for 5 minutes 3-4 times a day. Return for worsening pain, swelling, prolonged bleeding, pus buildup.

## 2019-08-03 LAB — OB RESULTS CONSOLE GBS: GBS: NEGATIVE

## 2019-08-30 ENCOUNTER — Telehealth (HOSPITAL_COMMUNITY): Payer: Self-pay | Admitting: *Deleted

## 2019-08-30 ENCOUNTER — Encounter (HOSPITAL_COMMUNITY): Payer: Self-pay | Admitting: *Deleted

## 2019-08-30 NOTE — Telephone Encounter (Signed)
Preadmission screen  

## 2019-08-31 ENCOUNTER — Telehealth (HOSPITAL_COMMUNITY): Payer: Self-pay | Admitting: *Deleted

## 2019-08-31 NOTE — Telephone Encounter (Signed)
Preadmission screen  

## 2019-09-01 ENCOUNTER — Encounter (HOSPITAL_COMMUNITY): Payer: Self-pay | Admitting: *Deleted

## 2019-09-01 ENCOUNTER — Telehealth (HOSPITAL_COMMUNITY): Payer: Self-pay | Admitting: *Deleted

## 2019-09-01 NOTE — Telephone Encounter (Signed)
Preadmission screen  

## 2019-09-01 NOTE — H&P (Signed)
Leah Cox is a 28 y.o. female presenting for two stage IOL. 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 No symptoms of recurrence. OB History    Gravida  1   Para      Term      Preterm      AB      Living        SAB      TAB      Ectopic      Multiple      Live Births             Past Medical History:  Diagnosis Date  . Allergy    seasonal  . Medical history non-contributory    Past Surgical History:  Procedure Laterality Date  . NO PAST SURGERIES     Family History: family history includes Diabetes in her sister; Hypertension in her mother. Social History:  reports that she has never smoked. She has never used smokeless tobacco. She reports that she does not drink alcohol or use drugs.     Maternal Diabetes: No Genetic Screening: Normal Maternal Ultrasounds/Referrals: Normal Fetal Ultrasounds or other Referrals:  None Maternal Substance Abuse:  No Significant Maternal Medications:  None Significant Maternal Lab Results:  Group B Strep negative Other Comments:  None  Review of Systems  Eyes: Negative for blurred vision.  Gastrointestinal: Negative for abdominal pain.  Neurological: Negative for headaches.   History   Last menstrual period 11/27/2018. Maternal Exam:  Abdomen: Fetal presentation: vertex     Physical Exam  Cardiovascular: Normal rate.  Respiratory: Effort normal.  GI: Soft.    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

## 2019-09-04 ENCOUNTER — Other Ambulatory Visit (HOSPITAL_COMMUNITY)
Admission: RE | Admit: 2019-09-04 | Discharge: 2019-09-04 | Disposition: A | Discharge status: Home / Self Care | Payer: Federal, State, Local not specified - PPO | Source: Ambulatory Visit | Attending: Obstetrics and Gynecology | Admitting: Obstetrics and Gynecology

## 2019-09-04 DIAGNOSIS — Z20828 Contact with and (suspected) exposure to other viral communicable diseases: Secondary | ICD-10-CM | POA: Insufficient documentation

## 2019-09-04 DIAGNOSIS — Z01812 Encounter for preprocedural laboratory examination: Secondary | ICD-10-CM | POA: Insufficient documentation

## 2019-09-04 LAB — SARS CORONAVIRUS 2 (TAT 6-24 HRS): SARS Coronavirus 2: NEGATIVE

## 2019-09-07 ENCOUNTER — Encounter (HOSPITAL_COMMUNITY)
Admission: AD | Disposition: A | Discharge status: Home / Self Care | Payer: Self-pay | Source: Home / Self Care | Attending: Obstetrics and Gynecology

## 2019-09-07 ENCOUNTER — Inpatient Hospital Stay (HOSPITAL_COMMUNITY): Payer: Federal, State, Local not specified - PPO | Admitting: Anesthesiology

## 2019-09-07 ENCOUNTER — Encounter (HOSPITAL_COMMUNITY): Payer: Self-pay

## 2019-09-07 ENCOUNTER — Other Ambulatory Visit: Payer: Self-pay

## 2019-09-07 ENCOUNTER — Inpatient Hospital Stay (HOSPITAL_COMMUNITY)
Admission: AD | Admit: 2019-09-07 | Discharge: 2019-09-09 | DRG: 788 | Disposition: A | Discharge status: Home / Self Care | Payer: Federal, State, Local not specified - PPO | Attending: Obstetrics and Gynecology | Admitting: Obstetrics and Gynecology

## 2019-09-07 ENCOUNTER — Inpatient Hospital Stay (HOSPITAL_COMMUNITY): Payer: Federal, State, Local not specified - PPO

## 2019-09-07 DIAGNOSIS — A6000 Herpesviral infection of urogenital system, unspecified: Secondary | ICD-10-CM | POA: Diagnosis present

## 2019-09-07 DIAGNOSIS — Z3A40 40 weeks gestation of pregnancy: Secondary | ICD-10-CM

## 2019-09-07 DIAGNOSIS — O9832 Other infections with a predominantly sexual mode of transmission complicating childbirth: Secondary | ICD-10-CM | POA: Diagnosis present

## 2019-09-07 DIAGNOSIS — Z3490 Encounter for supervision of normal pregnancy, unspecified, unspecified trimester: Secondary | ICD-10-CM

## 2019-09-07 LAB — TYPE AND SCREEN
ABO/RH(D): O POS
Antibody Screen: NEGATIVE

## 2019-09-07 LAB — CBC
HCT: 34.7 % — ABNORMAL LOW (ref 36.0–46.0)
Hemoglobin: 11 g/dL — ABNORMAL LOW (ref 12.0–15.0)
MCH: 23.7 pg — ABNORMAL LOW (ref 26.0–34.0)
MCHC: 31.7 g/dL (ref 30.0–36.0)
MCV: 74.6 fL — ABNORMAL LOW (ref 80.0–100.0)
Platelets: 175 10*3/uL (ref 150–400)
RBC: 4.65 MIL/uL (ref 3.87–5.11)
RDW: 15.5 % (ref 11.5–15.5)
WBC: 10.5 10*3/uL (ref 4.0–10.5)
nRBC: 0 % (ref 0.0–0.2)

## 2019-09-07 LAB — ABO/RH: ABO/RH(D): O POS

## 2019-09-07 LAB — RPR: RPR Ser Ql: NONREACTIVE

## 2019-09-07 SURGERY — Surgical Case
Anesthesia: Spinal | Site: Abdomen | Wound class: Clean Contaminated

## 2019-09-07 MED ORDER — MORPHINE SULFATE (PF) 0.5 MG/ML IJ SOLN
INTRAMUSCULAR | Status: DC | PRN
  Administered 2019-09-07: .15 mg via INTRATHECAL

## 2019-09-07 MED ORDER — LACTATED RINGERS IV SOLN
INTRAVENOUS | Status: DC
  Administered 2019-09-07: 20:00:00 via INTRAVENOUS

## 2019-09-07 MED ORDER — LACTATED RINGERS IV SOLN
INTRAVENOUS | Status: DC | PRN
  Administered 2019-09-07 (×2): via INTRAVENOUS

## 2019-09-07 MED ORDER — ACETAMINOPHEN 325 MG PO TABS
650.0000 mg | ORAL_TABLET | ORAL | Status: DC | PRN
  Administered 2019-09-08 (×2): 650 mg via ORAL
  Filled 2019-09-07 (×3): qty 2

## 2019-09-07 MED ORDER — DIPHENHYDRAMINE HCL 25 MG PO CAPS: 25.0000 mg | ORAL_CAPSULE | Freq: Four times a day (QID) | ORAL | Status: DC | PRN

## 2019-09-07 MED ORDER — HYDROMORPHONE HCL 1 MG/ML IJ SOLN
0.2000 mg | INTRAMUSCULAR | Status: DC | PRN
  Administered 2019-09-08: 0.4 mg via INTRAVENOUS
  Filled 2019-09-07: qty 1

## 2019-09-07 MED ORDER — ONDANSETRON HCL 4 MG/2ML IJ SOLN: 4.0000 mg | Freq: Four times a day (QID) | INTRAMUSCULAR | Status: DC | PRN

## 2019-09-07 MED ORDER — STERILE WATER FOR IRRIGATION IR SOLN
Status: DC | PRN
  Administered 2019-09-07: 1

## 2019-09-07 MED ORDER — PHENYLEPHRINE HCL-NACL 20-0.9 MG/250ML-% IV SOLN
INTRAVENOUS | Status: DC | PRN
  Administered 2019-09-07: 60 ug/min via INTRAVENOUS

## 2019-09-07 MED ORDER — PHENYLEPHRINE HCL-NACL 20-0.9 MG/250ML-% IV SOLN
INTRAVENOUS | Status: AC
  Filled 2019-09-07: qty 250

## 2019-09-07 MED ORDER — TERBUTALINE SULFATE 1 MG/ML IJ SOLN: 0.2500 mg | Freq: Once | INTRAMUSCULAR | Status: DC | PRN

## 2019-09-07 MED ORDER — DIPHENHYDRAMINE HCL 25 MG PO CAPS: 25.0000 mg | ORAL_CAPSULE | ORAL | Status: DC | PRN

## 2019-09-07 MED ORDER — NALBUPHINE SYRINGE 5 MG/0.5 ML
5.0000 mg | INJECTION | INTRAMUSCULAR | Status: DC | PRN
  Filled 2019-09-07 (×2): qty 0.5

## 2019-09-07 MED ORDER — SCOPOLAMINE 1 MG/3DAYS TD PT72
1.0000 | MEDICATED_PATCH | Freq: Once | TRANSDERMAL | Status: DC
  Administered 2019-09-07: 1.5 mg via TRANSDERMAL
  Filled 2019-09-07: qty 1

## 2019-09-07 MED ORDER — SENNOSIDES-DOCUSATE SODIUM 8.6-50 MG PO TABS
2.0000 | ORAL_TABLET | ORAL | Status: DC
  Administered 2019-09-07 – 2019-09-08 (×2): 2 via ORAL
  Filled 2019-09-07 (×2): qty 2

## 2019-09-07 MED ORDER — LACTATED RINGERS IV SOLN: 500.0000 mL | INTRAVENOUS | Status: DC | PRN

## 2019-09-07 MED ORDER — COCONUT OIL OIL: 1.0000 "application " | TOPICAL_OIL | Status: DC | PRN

## 2019-09-07 MED ORDER — LIDOCAINE HCL (PF) 1 % IJ SOLN: 30.0000 mL | INTRAMUSCULAR | Status: DC | PRN

## 2019-09-07 MED ORDER — FENTANYL CITRATE (PF) 100 MCG/2ML IJ SOLN
INTRAMUSCULAR | Status: DC | PRN
  Administered 2019-09-07: 15 ug via INTRATHECAL

## 2019-09-07 MED ORDER — MENTHOL 3 MG MT LOZG: 1.0000 | LOZENGE | OROMUCOSAL | Status: DC | PRN

## 2019-09-07 MED ORDER — DEXAMETHASONE SODIUM PHOSPHATE 4 MG/ML IJ SOLN
INTRAMUSCULAR | Status: DC | PRN
  Administered 2019-09-07: 4 mg via INTRAVENOUS

## 2019-09-07 MED ORDER — FENTANYL CITRATE (PF) 100 MCG/2ML IJ SOLN: 50.0000 ug | INTRAMUSCULAR | Status: DC | PRN

## 2019-09-07 MED ORDER — TETANUS-DIPHTH-ACELL PERTUSSIS 5-2.5-18.5 LF-MCG/0.5 IM SUSP: 0.5000 mL | Freq: Once | INTRAMUSCULAR | Status: DC

## 2019-09-07 MED ORDER — SIMETHICONE 80 MG PO CHEW
80.0000 mg | CHEWABLE_TABLET | Freq: Three times a day (TID) | ORAL | Status: DC
  Administered 2019-09-08 – 2019-09-09 (×4): 80 mg via ORAL
  Filled 2019-09-07 (×4): qty 1

## 2019-09-07 MED ORDER — CEFAZOLIN SODIUM-DEXTROSE 2-4 GM/100ML-% IV SOLN
INTRAVENOUS | Status: AC
  Filled 2019-09-07: qty 100

## 2019-09-07 MED ORDER — KETOROLAC TROMETHAMINE 30 MG/ML IJ SOLN
30.0000 mg | Freq: Once | INTRAMUSCULAR | Status: AC | PRN
  Administered 2019-09-07: 30 mg via INTRAVENOUS

## 2019-09-07 MED ORDER — KETOROLAC TROMETHAMINE 30 MG/ML IJ SOLN
INTRAMUSCULAR | Status: AC
  Filled 2019-09-07: qty 1

## 2019-09-07 MED ORDER — TRAMADOL HCL 50 MG PO TABS
50.0000 mg | ORAL_TABLET | Freq: Four times a day (QID) | ORAL | Status: DC | PRN
  Administered 2019-09-07 – 2019-09-09 (×4): 50 mg via ORAL
  Filled 2019-09-07 (×5): qty 1

## 2019-09-07 MED ORDER — FLEET ENEMA 7-19 GM/118ML RE ENEM: 1.0000 | ENEMA | RECTAL | Status: DC | PRN

## 2019-09-07 MED ORDER — MEPERIDINE HCL 25 MG/ML IJ SOLN: 6.2500 mg | INTRAMUSCULAR | Status: DC | PRN

## 2019-09-07 MED ORDER — LACTATED RINGERS IV SOLN
INTRAVENOUS | Status: DC
  Administered 2019-09-07 (×2): via INTRAVENOUS

## 2019-09-07 MED ORDER — ACETAMINOPHEN 325 MG PO TABS: 650.0000 mg | ORAL_TABLET | ORAL | Status: DC | PRN

## 2019-09-07 MED ORDER — NALBUPHINE SYRINGE 5 MG/0.5 ML
5.0000 mg | INJECTION | Freq: Once | INTRAMUSCULAR | Status: DC | PRN
  Filled 2019-09-07: qty 0.5

## 2019-09-07 MED ORDER — BUPIVACAINE IN DEXTROSE 0.75-8.25 % IT SOLN
INTRATHECAL | Status: DC | PRN
  Administered 2019-09-07: 1.6 mg via INTRATHECAL

## 2019-09-07 MED ORDER — SIMETHICONE 80 MG PO CHEW
80.0000 mg | CHEWABLE_TABLET | ORAL | Status: DC
  Administered 2019-09-07 – 2019-09-08 (×2): 80 mg via ORAL
  Filled 2019-09-07 (×2): qty 1

## 2019-09-07 MED ORDER — DEXAMETHASONE SODIUM PHOSPHATE 4 MG/ML IJ SOLN
INTRAMUSCULAR | Status: AC
  Filled 2019-09-07: qty 1

## 2019-09-07 MED ORDER — SIMETHICONE 80 MG PO CHEW: 80.0000 mg | CHEWABLE_TABLET | ORAL | Status: DC | PRN

## 2019-09-07 MED ORDER — ONDANSETRON HCL 4 MG/2ML IJ SOLN
INTRAMUSCULAR | Status: DC | PRN
  Administered 2019-09-07: 4 mg via INTRAVENOUS

## 2019-09-07 MED ORDER — SODIUM CHLORIDE 0.9 % IR SOLN
Status: DC | PRN
  Administered 2019-09-07: 1

## 2019-09-07 MED ORDER — OXYTOCIN 40 UNITS IN NORMAL SALINE INFUSION - SIMPLE MED
INTRAVENOUS | Status: DC | PRN
  Administered 2019-09-07: 40 [IU] via INTRAVENOUS

## 2019-09-07 MED ORDER — MORPHINE SULFATE (PF) 0.5 MG/ML IJ SOLN
INTRAMUSCULAR | Status: AC
  Filled 2019-09-07: qty 10

## 2019-09-07 MED ORDER — OXYTOCIN 40 UNITS IN NORMAL SALINE INFUSION - SIMPLE MED
INTRAVENOUS | Status: AC
  Filled 2019-09-07: qty 1000

## 2019-09-07 MED ORDER — SOD CITRATE-CITRIC ACID 500-334 MG/5ML PO SOLN
30.0000 mL | ORAL | Status: DC | PRN
  Administered 2019-09-07: 30 mL via ORAL
  Filled 2019-09-07: qty 30

## 2019-09-07 MED ORDER — NALOXONE HCL 0.4 MG/ML IJ SOLN: 0.4000 mg | INTRAMUSCULAR | Status: DC | PRN

## 2019-09-07 MED ORDER — PRENATAL MULTIVITAMIN CH
1.0000 | ORAL_TABLET | Freq: Every day | ORAL | Status: DC
  Administered 2019-09-08 – 2019-09-09 (×2): 1 via ORAL
  Filled 2019-09-07 (×2): qty 1

## 2019-09-07 MED ORDER — ZOLPIDEM TARTRATE 5 MG PO TABS: 5.0000 mg | ORAL_TABLET | Freq: Every evening | ORAL | Status: DC | PRN

## 2019-09-07 MED ORDER — OXYTOCIN 40 UNITS IN NORMAL SALINE INFUSION - SIMPLE MED: 2.5000 [IU]/h | INTRAVENOUS | Status: AC

## 2019-09-07 MED ORDER — SODIUM CHLORIDE 0.9% FLUSH: 3.0000 mL | INTRAVENOUS | Status: DC | PRN

## 2019-09-07 MED ORDER — NALBUPHINE SYRINGE 5 MG/0.5 ML
5.0000 mg | INJECTION | INTRAMUSCULAR | Status: DC | PRN
  Filled 2019-09-07: qty 0.5

## 2019-09-07 MED ORDER — PROMETHAZINE HCL 25 MG/ML IJ SOLN: 6.2500 mg | INTRAMUSCULAR | Status: DC | PRN

## 2019-09-07 MED ORDER — NALOXONE HCL 4 MG/10ML IJ SOLN
1.0000 ug/kg/h | INTRAVENOUS | Status: DC | PRN
  Filled 2019-09-07: qty 5

## 2019-09-07 MED ORDER — CEFAZOLIN SODIUM-DEXTROSE 2-3 GM-%(50ML) IV SOLR
INTRAVENOUS | Status: DC | PRN
  Administered 2019-09-07: 2 g via INTRAVENOUS

## 2019-09-07 MED ORDER — ONDANSETRON HCL 4 MG/2ML IJ SOLN: 4.0000 mg | Freq: Three times a day (TID) | INTRAMUSCULAR | Status: DC | PRN

## 2019-09-07 MED ORDER — OXYTOCIN 40 UNITS IN NORMAL SALINE INFUSION - SIMPLE MED: 2.5000 [IU]/h | INTRAVENOUS | Status: DC

## 2019-09-07 MED ORDER — WITCH HAZEL-GLYCERIN EX PADS
1.0000 "application " | MEDICATED_PAD | CUTANEOUS | Status: DC | PRN
  Administered 2019-09-08: 1 via TOPICAL

## 2019-09-07 MED ORDER — MISOPROSTOL 25 MCG QUARTER TABLET
25.0000 ug | ORAL_TABLET | ORAL | Status: DC | PRN
  Administered 2019-09-07 (×2): 25 ug via VAGINAL
  Filled 2019-09-07 (×2): qty 1

## 2019-09-07 MED ORDER — SODIUM CHLORIDE 0.9 % IV SOLN
INTRAVENOUS | Status: DC | PRN
  Administered 2019-09-07: 10:00:00 via INTRAVENOUS

## 2019-09-07 MED ORDER — ONDANSETRON HCL 4 MG/2ML IJ SOLN
INTRAMUSCULAR | Status: AC
  Filled 2019-09-07: qty 2

## 2019-09-07 MED ORDER — FENTANYL CITRATE (PF) 100 MCG/2ML IJ SOLN
INTRAMUSCULAR | Status: AC
  Filled 2019-09-07: qty 2

## 2019-09-07 MED ORDER — DIPHENHYDRAMINE HCL 50 MG/ML IJ SOLN: 12.5000 mg | INTRAMUSCULAR | Status: DC | PRN

## 2019-09-07 MED ORDER — DIBUCAINE (PERIANAL) 1 % EX OINT: 1.0000 "application " | TOPICAL_OINTMENT | CUTANEOUS | Status: DC | PRN

## 2019-09-07 MED ORDER — ZOLPIDEM TARTRATE 5 MG PO TABS
5.0000 mg | ORAL_TABLET | Freq: Every evening | ORAL | Status: DC | PRN
  Administered 2019-09-07: 5 mg via ORAL
  Filled 2019-09-07: qty 1

## 2019-09-07 MED ORDER — OXYTOCIN BOLUS FROM INFUSION: 500.0000 mL | Freq: Once | INTRAVENOUS | Status: DC

## 2019-09-07 SURGICAL SUPPLY — 39 items
ADH SKN CLS APL DERMABOND .7 (GAUZE/BANDAGES/DRESSINGS)
APL SKNCLS STERI-STRIP NONHPOA (GAUZE/BANDAGES/DRESSINGS) ×1
BENZOIN TINCTURE PRP APPL 2/3 (GAUZE/BANDAGES/DRESSINGS) ×2 IMPLANT
CHLORAPREP W/TINT 26ML (MISCELLANEOUS) ×3 IMPLANT
CLAMP CORD UMBIL (MISCELLANEOUS) IMPLANT
CLOSURE WOUND 1/2 X4 (GAUZE/BANDAGES/DRESSINGS) ×1
CLOTH BEACON ORANGE TIMEOUT ST (SAFETY) ×3 IMPLANT
DERMABOND ADVANCED (GAUZE/BANDAGES/DRESSINGS)
DERMABOND ADVANCED .7 DNX12 (GAUZE/BANDAGES/DRESSINGS) IMPLANT
DRSG OPSITE POSTOP 4X10 (GAUZE/BANDAGES/DRESSINGS) ×3 IMPLANT
ELECT REM PT RETURN 9FT ADLT (ELECTROSURGICAL) ×3
ELECTRODE REM PT RTRN 9FT ADLT (ELECTROSURGICAL) ×1 IMPLANT
EXTRACTOR VACUUM M CUP 4 TUBE (SUCTIONS) IMPLANT
EXTRACTOR VACUUM M CUP 4' TUBE (SUCTIONS)
GLOVE BIO SURGEON STRL SZ7.5 (GLOVE) ×3 IMPLANT
GLOVE BIOGEL PI IND STRL 7.0 (GLOVE) ×1 IMPLANT
GLOVE BIOGEL PI INDICATOR 7.0 (GLOVE) ×2
GOWN STRL REUS W/TWL LRG LVL3 (GOWN DISPOSABLE) ×6 IMPLANT
KIT ABG SYR 3ML LUER SLIP (SYRINGE) ×3 IMPLANT
NDL HYPO 25X5/8 SAFETYGLIDE (NEEDLE) ×1 IMPLANT
NEEDLE HYPO 25X5/8 SAFETYGLIDE (NEEDLE) ×3 IMPLANT
NS IRRIG 1000ML POUR BTL (IV SOLUTION) ×3 IMPLANT
PACK C SECTION WH (CUSTOM PROCEDURE TRAY) ×3 IMPLANT
PAD ABD 7.5X8 STRL (GAUZE/BANDAGES/DRESSINGS) ×2 IMPLANT
PAD OB MATERNITY 4.3X12.25 (PERSONAL CARE ITEMS) ×3 IMPLANT
PENCIL SMOKE EVAC W/HOLSTER (ELECTROSURGICAL) ×3 IMPLANT
SPONGE GAUZE 4X4 12PLY STER LF (GAUZE/BANDAGES/DRESSINGS) ×4 IMPLANT
STRIP CLOSURE SKIN 1/2X4 (GAUZE/BANDAGES/DRESSINGS) ×1 IMPLANT
SUT MNCRL 0 VIOLET CTX 36 (SUTURE) ×4 IMPLANT
SUT MONOCRYL 0 CTX 36 (SUTURE) ×8
SUT PDS AB 0 CTX 60 (SUTURE) ×3 IMPLANT
SUT PLAIN 0 NONE (SUTURE) IMPLANT
SUT PLAIN 2 0 (SUTURE)
SUT PLAIN 2 0 XLH (SUTURE) IMPLANT
SUT PLAIN ABS 2-0 CT1 27XMFL (SUTURE) IMPLANT
SUT VIC AB 4-0 KS 27 (SUTURE) ×3 IMPLANT
TOWEL OR 17X24 6PK STRL BLUE (TOWEL DISPOSABLE) ×3 IMPLANT
TRAY FOLEY W/BAG SLVR 14FR LF (SET/KITS/TRAYS/PACK) ×3 IMPLANT
WATER STERILE IRR 1000ML POUR (IV SOLUTION) ×3 IMPLANT

## 2019-09-07 NOTE — Lactation Note (Signed)
This note was copied from a baby's chart. Lactation Consultation Note  Patient Name: Leah Cox Today's Date: 09/07/2019 Reason for consult: Initial assessment;Term;Primapara;1st time breastfeeding  P1 mother whose infant is now 54 hours old.    Baby was swaddled and asleep in the bassinet when I arrived.  Mother had no immediate questions/concerns related to breast feeding.  Mother appeared very sleepy.  Encouraged to feed 8-12 times/24 hours or sooner if baby shows feeding cues.  Reviewed cues.  Mother does not know how to perform hand expression and does not desire to learn at this time.  Colostrum container provided and milk storage times reviewed.  Finger feeding demonstrated.    Mother has a DEBP for home use.  Mom made aware of O/P services, breastfeeding support groups, community resources, and our phone # for post-discharge questions. Father present.  Mother will call for latch assistance as needed.  RN will help mother with hand expression later today.  Father present.   Maternal Data Formula Feeding for Exclusion: No Has patient been taught Hand Expression?: No(Mother does not wish to practice hand expression at this time; will have RN demonstrated when mother is ready) Does the patient have breastfeeding experience prior to this delivery?: No  Feeding Feeding Type: Breast Milk  LATCH Score Latch: Repeated attempts needed to sustain latch, nipple held in mouth throughout feeding, stimulation needed to elicit sucking reflex.  Audible Swallowing: None  Type of Nipple: Everted at rest and after stimulation  Comfort (Breast/Nipple): Soft / non-tender  Hold (Positioning): Assistance needed to correctly position infant at breast and maintain latch.  LATCH Score: 6  Interventions    Lactation Tools Discussed/Used     Consult Status Consult Status: Follow-up Date: 09/08/19 Follow-up type: In-patient    Little Ishikawa 09/07/2019, 12:18 PM

## 2019-09-07 NOTE — Anesthesia Procedure Notes (Signed)
Spinal  Patient location during procedure: OB Start time: 09/07/2019 9:26 AM End time: 09/07/2019 9:31 AM Staffing Anesthesiologist: Lynda Rainwater, MD Performed: anesthesiologist  Preanesthetic Checklist Completed: patient identified, surgical consent, pre-op evaluation, timeout performed, IV checked, risks and benefits discussed and monitors and equipment checked Spinal Block Patient position: sitting Prep: site prepped and draped and DuraPrep Patient monitoring: heart rate, cardiac monitor, continuous pulse ox and blood pressure Approach: midline Location: L3-4 Injection technique: single-shot Needle Needle type: Pencan  Needle gauge: 24 G Needle length: 10 cm Assessment Sensory level: T4

## 2019-09-07 NOTE — Anesthesia Preprocedure Evaluation (Signed)
Anesthesia Evaluation  Patient identified by MRN, date of birth, ID band Patient awake    Reviewed: Allergy & Precautions, NPO status , Patient's Chart, lab work & pertinent test results  Airway Mallampati: II  TM Distance: >3 FB Neck ROM: Full    Dental no notable dental hx.    Pulmonary neg pulmonary ROS,    Pulmonary exam normal breath sounds clear to auscultation       Cardiovascular negative cardio ROS Normal cardiovascular exam Rhythm:Regular Rate:Normal     Neuro/Psych negative neurological ROS  negative psych ROS   GI/Hepatic negative GI ROS, Neg liver ROS,   Endo/Other  negative endocrine ROS  Renal/GU negative Renal ROS  negative genitourinary   Musculoskeletal negative musculoskeletal ROS (+)   Abdominal (+) + obese,   Peds negative pediatric ROS (+)  Hematology negative hematology ROS (+)   Anesthesia Other Findings   Reproductive/Obstetrics (+) Pregnancy                             Anesthesia Physical Anesthesia Plan  ASA: II  Anesthesia Plan: Spinal   Post-op Pain Management:    Induction:   PONV Risk Score and Plan: 2 and Treatment may vary due to age or medical condition  Airway Management Planned: Natural Airway  Additional Equipment:   Intra-op Plan:   Post-operative Plan:   Informed Consent: I have reviewed the patients History and Physical, chart, labs and discussed the procedure including the risks, benefits and alternatives for the proposed anesthesia with the patient or authorized representative who has indicated his/her understanding and acceptance.     Dental advisory given  Plan Discussed with: CRNA  Anesthesia Plan Comments:         Anesthesia Quick Evaluation

## 2019-09-07 NOTE — Brief Op Note (Signed)
09/07/2019  10:20 AM  PATIENT:  Leah Cox  28 y.o. female  PRE-OPERATIVE DIAGNOSIS:  fetal indication  POST-OPERATIVE DIAGNOSIS:  fetal indication  PROCEDURE:  Procedure(s): CESAREAN SECTION (N/A)  SURGEON:  Surgeon(s) and Role:    * Everlene Farrier, MD - Primary  PHYSICIAN ASSISTANT:   ASSISTANTS: none   ANESTHESIA:   spinal  EBL:  Per anesthesiology note   BLOOD ADMINISTERED:none  DRAINS: Urinary Catheter (Foley)   LOCAL MEDICATIONS USED:  NONE  SPECIMEN:  Source of Specimen:  placenta  DISPOSITION OF SPECIMEN:  PATHOLOGY  COUNTS:  YES  TOURNIQUET:  * No tourniquets in log *  DICTATION: .Other Dictation: Dictation Number [id number]  PLAN OF CARE: Admit to inpatient   PATIENT DISPOSITION:  PACU - hemodynamically stable.   Delay start of Pharmacological VTE agent (>24hrs) due to surgical blood loss or risk of bleeding: not applicable

## 2019-09-07 NOTE — Anesthesia Postprocedure Evaluation (Signed)
Anesthesia Post Note  Patient: Leah Cox  Procedure(s) Performed: CESAREAN SECTION (N/A Abdomen)     Patient location during evaluation: PACU Anesthesia Type: Spinal Level of consciousness: oriented and awake and alert Pain management: pain level controlled Vital Signs Assessment: post-procedure vital signs reviewed and stable Respiratory status: spontaneous breathing and respiratory function stable Cardiovascular status: blood pressure returned to baseline and stable Postop Assessment: no headache, no backache and no apparent nausea or vomiting Anesthetic complications: no    Last Vitals:  Vitals:   09/07/19 1143 09/07/19 1153  BP: 124/84 (!) 128/91  Pulse: 70 69  Resp: 17 15  Temp: (!) 36.2 C 36.6 C  SpO2: 99% 100%    Last Pain:  Vitals:   09/07/19 1153  TempSrc: Axillary   Pain Goal:                   Leah Cox

## 2019-09-07 NOTE — Op Note (Signed)
NAME: Leah Cox, Leah Cox MEDICAL RECORD [id number] ACCOUNT [id number] DATE OF BIRTH:1991/09/06 FACILITY: [facility] LOCATION: [place], [place]  OPERATIVE REPORT  DATE OF PROCEDURE:  09/07/2019  PREOPERATIVE DIAGNOSIS:  Nonreassuring fetal heart tracing.  POSTOPERATIVE DIAGNOSIS:  Nonreassuring fetal heart tracing.  PROCEDURE:  Primary low transverse cesarean section.  SURGEON:  Everlene Farrier, MD  ANESTHESIA:  Spinal.  ESTIMATED BLOOD LOSS:  Per anesthesia note.  FINDINGS:  Viable female infant.  Arterial cord pH 7.04.  Birth weight pending.  INDICATIONS AND CONSENT:  This patient is a 28 year old G1 P0 at 40-3/7 weeks who was admitted early this a.m. for two stage induction of labor.  She had mild irregular contractions.  The baby displayed intermittent variable decels sometimes lasting two  to three minutes.  There were also intermittent late decelerations noted.  Cervix is 1 and thick and anticipated long length of induction of labor.  Due to the nonreassuring status of the fetal heart tracing, I recommend cesarean section.  Potential  risks and complications were discussed preoperatively including but not limited to infection, organ damage, bleeding requiring transfusion of blood products with HIV and hepatitis acquisition, DVT, PE, pneumonia.  The patient states she understands and  agrees and consent was signed on the chart.  DESCRIPTION OF PROCEDURE:  The patient was taken to the operating room where she was identified.  Spinal anesthetic was placed per anesthesiology and she was placed in the dorsal supine position with a left lateral wedge.  She was prepped vaginally with  Betadine.  Foley catheter was placed and she was prepped abdominally with ChloraPrep.  After a 3 minute drying time, she was draped in a sterile fashion.  Timeout was undertaken.  Testing revealed adequate spinal anesthesia.  Skin was incised in a  Pfannenstiel fashion and dissection was  carried out in layers to the peritoneum, which was taken down superiorly and inferiorly.  Vesicouterine peritoneum was taken down cephalad laterally.  Bladder flap was developed and the bladder blade was placed.   Uterus was incised in a low transverse manner and the uterine cavity was entered bluntly with a hemostat for clear fluid.  Uterine incision was extended bilaterally with fingers.  Baby was then delivered from the vertex position.  Good cry and tone was  noted.  Cord was clamped and cut after 1 minute observation time and the baby was handed to waiting pediatrics team.  Placenta was manually delivered and sent to pathology.  Uterine cavity was clean.  Uterus was closed in two running locking imbricating  layers of 0 Monocryl suture, which achieved good hemostasis.  Lavage was carried out.  Anterior peritoneum was closed in a running fashion with 0 Monocryl suture, which was also used to reapproximate the pyramidalis muscle in the midline.  Anterior  rectus fascia was closed in a running fashion with a 0 looped PDS.  Subcutaneous layer was closed with interrupted plain and the skin was closed in a subcuticular fashion with Vicryl on a Keith needle.  Benzoin, Steri-Strips, honeycomb dressing and  pressure dressing are applied.  All counts were correct and the patient was taken to recovery room in stable condition.  TN/NUANCE  D:09/07/2019 T:09/07/2019 JOB:[id number]

## 2019-09-07 NOTE — Transfer of Care (Signed)
Immediate Anesthesia Transfer of Care Note  Patient: Leah Cox  Procedure(s) Performed: CESAREAN SECTION (N/A Abdomen)  Patient Location: PACU  Anesthesia Type:Spinal  Level of Consciousness: awake  Airway & Oxygen Therapy: Patient Spontanous Breathing  Post-op Assessment: Report given to RN and Post -op Vital signs reviewed and stable  Post vital signs: stable  Last Vitals:  Vitals Value Taken Time  BP 124/83 09/07/19 1100  Temp 36.4 C 09/07/19 1035  Pulse 71 09/07/19 1109  Resp 18 09/07/19 1109  SpO2 98 % 09/07/19 1109  Vitals shown include unvalidated device data.  Last Pain:  Vitals:   09/07/19 1035  TempSrc: Oral         Complications: No apparent anesthesia complications

## 2019-09-07 NOTE — Progress Notes (Signed)
Intermittent variable decelerations, some 2-3 minutes, and some late decelerations UCs irregular/mild Cx 1/th at last check  A/P: D/W non reassuring FHT . In anticipation of prolonged IOL and FHT I recommend cesarean section. D/W risks including infection, organ damage, bleeding/transfusion-HIV/Hep, DVT/PE, pneumonia. Patient states she understands and agrees.

## 2019-09-08 ENCOUNTER — Encounter (HOSPITAL_COMMUNITY): Payer: Self-pay | Admitting: Obstetrics and Gynecology

## 2019-09-08 LAB — CBC
HCT: 30.8 % — ABNORMAL LOW (ref 36.0–46.0)
Hemoglobin: 10 g/dL — ABNORMAL LOW (ref 12.0–15.0)
MCH: 24 pg — ABNORMAL LOW (ref 26.0–34.0)
MCHC: 32.5 g/dL (ref 30.0–36.0)
MCV: 73.9 fL — ABNORMAL LOW (ref 80.0–100.0)
Platelets: 183 10*3/uL (ref 150–400)
RBC: 4.17 MIL/uL (ref 3.87–5.11)
RDW: 15.5 % (ref 11.5–15.5)
WBC: 17.4 10*3/uL — ABNORMAL HIGH (ref 4.0–10.5)
nRBC: 0 % (ref 0.0–0.2)

## 2019-09-08 LAB — SURGICAL PATHOLOGY

## 2019-09-08 MED ORDER — HYDROMORPHONE HCL 2 MG PO TABS
2.0000 mg | ORAL_TABLET | ORAL | Status: DC | PRN
  Administered 2019-09-08 (×3): 2 mg via ORAL
  Filled 2019-09-08 (×3): qty 1

## 2019-09-08 NOTE — Progress Notes (Signed)
POD # 1  Doing well  BP 120/73 (BP Location: Left Arm)   Pulse 77   Temp 98.3 F (36.8 C) (Oral)   Resp 16   Ht 5\' 4"  (1.626 m)   Wt 107.1 kg   LMP 11/27/2018   SpO2 99%   Breastfeeding Unknown   BMI 40.54 kg/m   Results for orders placed or performed during the hospital encounter of 09/07/19 (from the past 24 hour(s))  CBC     Status: Abnormal   Collection Time: 09/08/19  5:54 AM  Result Value Ref Range   WBC 17.4 (H) 4.0 - 10.5 K/uL   RBC 4.17 3.87 - 5.11 MIL/uL   Hemoglobin 10.0 (L) 12.0 - 15.0 g/dL   HCT 30.8 (L) 36.0 - 46.0 %   MCV 73.9 (L) 80.0 - 100.0 fL   MCH 24.0 (L) 26.0 - 34.0 pg   MCHC 32.5 30.0 - 36.0 g/dL   RDW 15.5 11.5 - 15.5 %   Platelets 183 150 - 400 K/uL   nRBC 0.0 0.0 - 0.2 %   Abdomen is soft and non tender Bandage clean and dry   POD # 1  Doing well Routine care

## 2019-09-08 NOTE — Lactation Note (Addendum)
This note was copied from a baby's chart. Lactation Consultation Note  Patient Name: Leah Cox JJKKX'F Date: 09/08/2019 Reason for consult: Follow-up assessment;Mother's request;Difficult latch P1,15 hour female infant. Maternal hx: C/S delivery, HSV no current lesions on Valtrex Infant had one void and three stools since birth.  Infant has stuffy nose and sounds nasal,  LC suction nose with bulb syringe. Mom latched infant on right breast using football hold, infant only held breast in mouth, would suckle only a  few seconds and start to cry. [place] burped infant and noticed infant had large stool that LC changed. LC had infant to suckle on a  gloved finger , LC did suck training with infant , infant would suckle a little then stop. Mom latch infant again,  Infant held breast in mouth as mom was about to break latch infant started suckling  for about  4 minutes as [place] left room. Mom taught back hand expression and colostrum is present in breast. Mom will continue to work with latching infant at breast. Mom knows to call RN or [place] services to help assist with latch if needed. Mom knows to breastfeed according hunger cues, 8 to 12 times within 24 hours and on demand. Mom will do STS as much as possible with infant and hand express giving infant back volume if infant is reluctant to latch at breast.  Mom made aware of O/P services, breastfeeding support groups, community resources, and our phone # for post-discharge questions.   Maternal Data    Feeding Feeding Type: Breast Fed  LATCH Score Latch: Repeated attempts needed to sustain latch, nipple held in mouth throughout feeding, stimulation needed to elicit sucking reflex.  Audible Swallowing: A few with stimulation  Type of Nipple: Everted at rest and after stimulation  Comfort (Breast/Nipple): Soft / non-tender  Hold (Positioning): Assistance needed to correctly position infant at breast and maintain latch.  LATCH Score:  7  Interventions Interventions: Skin to skin;Support pillows;Adjust position;Position options;Breast massage;Breast compression;Assisted with latch  Lactation Tools Discussed/Used     Consult Status Consult Status: Follow-up Date: 09/08/19 Follow-up type: In-patient    Vicente Serene 09/08/2019, 12:52 AM

## 2019-09-08 NOTE — Lactation Note (Signed)
This note was copied from a baby's chart. Lactation Consultation Note  Patient Name: Leah Cox [id number] Date: 09/08/2019 Reason for consult: Follow-up assessment;Term;[place]ap[place];1st time breastfeeding  P1 mother whose infant is now 87 hours old.    Baby was getting her bath when I arrived.  RN in room and updated me on the breast feeding success mother has experienced since delivery.  Mother had no questions/concerns at this time.  Encouraged to continue feeding 8-12 times/24 hours of sooner if baby shows feeding cues.  Mother is familiar with cues and hand expression.  She is able to visualize and feed back colostrum drops.    Encouraged to call for latch assistance as needed.  Mother asked about pacifier use and I provided a good explanation as to why using a pacifier is not a good idea at this time.  Mother receptive to teaching.  LATCH scores have been 7 and 8 with the last two feedings.  Father present.   Maternal Data Formula Feeding for Exclusion: No Has patient been taught Hand Expression?: Yes Does the patient have breastfeeding experience prior to this delivery?: No  Feeding Feeding Type: Breast Fed  LATCH Score                   Interventions    Lactation Tools Discussed/Used     Consult Status Consult Status: Follow-up Date: 09/09/19 Follow-up type: In-patient    Little Ishikawa 09/08/2019, 3:55 PM

## 2019-09-09 MED ORDER — ACETAMINOPHEN 325 MG PO TABS: 650.0000 mg | ORAL_TABLET | ORAL | 0 refills | Status: AC | PRN

## 2019-09-09 MED ORDER — HYDROMORPHONE HCL 2 MG PO TABS: 2.0000 mg | ORAL_TABLET | ORAL | 0 refills | Status: DC | PRN

## 2019-09-09 NOTE — Discharge Summary (Signed)
Obstetric Discharge Summary Reason for Admission: induction of labor Prenatal Procedures: none Intrapartum Procedures: cesarean: low cervical, transverse for fetal intolerance of labor Postpartum Procedures: none Complications-Operative and Postpartum: none Hemoglobin  Date Value Ref Range Status  09/08/2019 10.0 (L) 12.0 - 15.0 g/dL Final   HCT  Date Value Ref Range Status  09/08/2019 30.8 (L) 36.0 - 46.0 % Final    Physical Exam:  General: alert, cooperative and appears stated age 28: appropriate Uterine Fundus: firm Incision: healing well, no significant drainage, no dehiscence DVT Evaluation: No evidence of DVT seen on physical exam. Negative Homan's sign. No cords or calf tenderness.  Discharge Diagnoses: Term Pregnancy-delivered  Discharge Information: Date: 09/09/2019 Activity: pelvic rest Diet: routine Medications: PNV, Ibuprofen and Tylenol, Dilaudid Condition: stable Instructions: refer to practice specific booklet Discharge to: home   Newborn Data: Live born female  Birth Weight: 8 lb 7.5 oz (3840 g) APGAR: 8, 9  Newborn Delivery   Birth date/time: 09/07/2019 09:49:00 Delivery type: C-Section, Low Transverse Trial of labor: Yes C-section categorization: Primary      Home with mother.  Linda Hedges 09/09/2019, 9:12 AM

## 2019-09-09 NOTE — Discharge Instructions (Signed)
Call MD for T>100.4, heavy vaginal bleeding, severe abdominal pain, intractable nausea and/or vomiting or respiratory distress.  Call office to schedule postpartum visit in 6 weeks.  Pelvic rest x 6 weeks.  No driving while taking pain medication.

## 2019-09-09 NOTE — Progress Notes (Signed)
Subjective: Postpartum Day 2: Cesarean Delivery Patient reports tolerating PO and no problems voiding.    Objective: Vital signs in last 24 hours: Temp:  [98 F (36.7 C)-98.2 F (36.8 C)] 98 F (36.7 C) (12/03 0534) Pulse Rate:  [79-87] 79 (12/03 0534) Resp:  [17-19] 17 (12/03 0534) BP: (130-138)/(78-97) 138/97 (12/03 0534) SpO2:  [97 %-100 %] 97 % (12/03 0534)  Physical Exam:  General: alert, cooperative and appears stated age Lochia: appropriate Uterine Fundus: firm Incision: healing well, no significant drainage, no dehiscence.  Pressure dressing removed; honeycomb dressing 50% stained with old blood. DVT Evaluation: No evidence of DVT seen on physical exam. Negative Homan's sign. No cords or calf tenderness.  Recent Labs    09/07/19 0042 09/08/19 0554  HGB 11.0* 10.0*  HCT 34.7* 30.8*    Assessment/Plan: Status post Cesarean section. Doing well postoperatively.  Discharge home with standard precautions and return to clinic in 4-6 weeks.  Linda Hedges 09/09/2019, 9:05 AM

## 2020-02-09 ENCOUNTER — Ambulatory Visit: Payer: Medicaid Other | Attending: Internal Medicine

## 2020-02-09 DIAGNOSIS — Z20822 Contact with and (suspected) exposure to covid-19: Secondary | ICD-10-CM

## 2020-02-10 LAB — SARS-COV-2, NAA 2 DAY TAT

## 2020-02-10 LAB — NOVEL CORONAVIRUS, NAA: SARS-CoV-2, NAA: NOT DETECTED

## 2020-06-03 ENCOUNTER — Emergency Department (HOSPITAL_COMMUNITY): Payer: Self-pay

## 2020-06-03 ENCOUNTER — Emergency Department (HOSPITAL_COMMUNITY)
Admission: EM | Admit: 2020-06-03 | Discharge: 2020-06-03 | Disposition: A | Discharge status: Home / Self Care | Payer: Self-pay | Attending: Emergency Medicine | Admitting: Emergency Medicine

## 2020-06-03 ENCOUNTER — Other Ambulatory Visit: Payer: Self-pay

## 2020-06-03 ENCOUNTER — Encounter (HOSPITAL_COMMUNITY): Payer: Self-pay | Admitting: Emergency Medicine

## 2020-06-03 DIAGNOSIS — Z79899 Other long term (current) drug therapy: Secondary | ICD-10-CM | POA: Insufficient documentation

## 2020-06-03 DIAGNOSIS — M25572 Pain in left ankle and joints of left foot: Secondary | ICD-10-CM | POA: Insufficient documentation

## 2020-06-03 MED ORDER — DICLOFENAC SODIUM 1 % EX GEL: 4.0000 g | Freq: Four times a day (QID) | CUTANEOUS | 2 refills | Status: DC

## 2020-06-03 NOTE — Discharge Instructions (Addendum)
You have been seen today for ankle pain. There were no acute abnormalities on the x-rays, including no sign of fracture or dislocation, however, there could be injuries to the soft tissues, such as the ligaments or tendons that are not seen on xrays. There could also be what are called occult fractures that are small fractures not seen on xray. Antiinflammatory medications: Take 600 mg of ibuprofen every 6 hours or 440 mg (over the counter dose) to 500 mg (prescription dose) of naproxen every 12 hours for the next 3 days. After this time, these medications may be used as needed for pain. Take these medications with food to avoid upset stomach. Choose only one of these medications, do not take them together. Acetaminophen (generic for Tylenol): Should you continue to have additional pain while taking the ibuprofen or naproxen, you may add in acetaminophen as needed. Your daily total maximum amount of acetaminophen from all sources should be limited to 4000mg /day for persons without liver problems, or 2000mg /day for those with liver problems. Diclofenac gel: This is a topical anti-inflammatory medication and can be applied directly to the painful region.  Do not use on the face or genitals.  This medication may be used as an alternative to oral anti-inflammatory medications, such as ibuprofen or naproxen. Ice: May apply ice to the area over the next 24 hours for 15 minutes at a time to reduce swelling. Elevation: Keep the extremity elevated as often as possible to reduce pain and inflammation. Support: Wear the ankle brace for support and comfort. Wear this until pain resolves.  Exercises: Start by performing these exercises a few times a week, increasing the frequency until you are performing them twice daily.  Follow up: If symptoms are improving, you may follow up with your primary care provider for any continued management. If symptoms are not starting to improve within a week, you should follow up with  the orthopedic specialist within two weeks. Return: Return to the ED for numbness, weakness, increasing pain, overall worsening symptoms, loss of function, or if symptoms are not improving, you have tried to follow up with the orthopedic specialist, and have been unable to do so.  For prescription assistance, may try using prescription discount sites or apps, such as goodrx.com or Good Rx smart phone app.

## 2020-06-03 NOTE — ED Provider Notes (Signed)
[facility] EMERGENCY DEPARTMENT Provider Note   CSN: [id number] Arrival date & time: 06/03/20  1615     History Chief Complaint  Patient presents with  . Ankle Pain    Leah Cox is a 29 y.o. female.  HPI      Leah Cox is a 29 y.o. female, presenting to the ED with left ankle pain for at least the last week, worse over the last couple days. 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 Intermittent tingling in the foot. Denies fever, deformity, definite trauma, or any other complaints.   Past Medical History:  Diagnosis Date  . Allergy    seasonal  . Medical history non-contributory     Patient Active Problem List   Diagnosis Date Noted  . Term pregnancy 09/07/2019    Past Surgical History:  Procedure Laterality Date  . CESAREAN SECTION N/A 09/07/2019   Procedure: CESAREAN SECTION;  Surgeon: Harold Hedge, MD;  Location: [facility] LD ORS;  Service: Obstetrics;  Laterality: N/A;  . NO PAST SURGERIES       OB History    Gravida  1   Para  1   Term  1   Preterm      AB      Living  1     SAB      TAB      Ectopic      Multiple  0   Live Births  1           Family History  Problem Relation Age of Onset  . Hypertension Mother   . Diabetes Sister        Insulin dependent    Social History   Tobacco Use  . Smoking status: Never Smoker  . Smokeless tobacco: Never Used  Vaping Use  . Vaping Use: Never used  Substance Use Topics  . Alcohol use: No  . Drug use: No    Home Medications Prior to Admission medications   Medication Sig Start Date End Date Taking? Authorizing Provider  acetaminophen (TYLENOL) 325 MG tablet Take 2 tablets (650 mg total) by mouth every 4 (four) hours as needed for mild pain (temperature > 101.5.). 09/09/19   Morris, Aundra Millet, DO  cetirizine (ZYRTEC) 10 MG tablet Take 10 mg by mouth daily.    [provider]  diclofenac Sodium (VOLTAREN) 1 % GEL Apply 4 g  topically 4 (four) times daily. 06/03/20   Zayana Salvador C, PA-C  HYDROmorphone (DILAUDID) 2 MG tablet Take 1 tablet (2 mg total) by mouth every 4 (four) hours as needed for severe pain (severe pain). 09/09/19   Morris, Aundra Millet, DO  ibuprofen (ADVIL,MOTRIN) 800 MG tablet Take 1 tablet (800 mg total) by mouth 3 (three) times daily. 12/04/18   Georgetta Haber, NP    Allergies    Codeine, Latex, and Nickel  Review of Systems   Review of Systems  Constitutional: Negative for fever.  Musculoskeletal: Positive for arthralgias.  Neurological: Negative for weakness and numbness.    Physical Exam Updated Vital Signs BP 122/79   Pulse 68   Temp 98.4 F (36.9 C) (Oral)   Resp 16   LMP 04/25/2020   SpO2 100%   Physical Exam Vitals and nursing note reviewed.  Constitutional:      General: She is not in acute distress.    Appearance: She is well-developed. She is not diaphoretic.  HENT:     Head:  Normocephalic and atraumatic.  Eyes:     Conjunctiva/sclera: Conjunctivae normal.  Cardiovascular:     Rate and Rhythm: Normal rate and regular rhythm.     Pulses:          Dorsalis pedis pulses are 2+ on the left side.       Posterior tibial pulses are 2+ on the left side.  Pulmonary:     Effort: Pulmonary effort is normal.  Musculoskeletal:     Cervical back: Neck supple.     Comments: Some pain and tenderness to the left medial ankle.  No definite swelling noted.  No color abnormality, deformity, or instability. No abnormalities noted in the rest of the left lower leg or foot.  Skin:    General: Skin is warm and dry.     Capillary Refill: Capillary refill takes less than 2 seconds.     Coloration: Skin is not pale.  Neurological:     Mental Status: She is alert.     Comments: Sensation light touch grossly intact in the left foot and toes. Motor function appropriately intact in left ankle and toes.  Psychiatric:        Behavior: Behavior normal.     ED Results / Procedures / Treatments    Labs (all labs ordered are listed, but only abnormal results are displayed) Labs Reviewed - No data to display  EKG None  Radiology DG Ankle Complete Left  Result Date: 06/03/2020 CLINICAL DATA:  C/o L ankle pain. States pain started while at work last Friday. Denies fall or any injury. EXAM: LEFT ANKLE COMPLETE - 3+ VIEW COMPARISON:  Left ankle radiographs 03/02/2016 FINDINGS: There is no evidence of fracture, dislocation, or joint effusion. There is no evidence of arthropathy or other focal bone abnormality. Soft tissues are unremarkable. IMPRESSION: Negative. Electronically Signed   By: Emmaline Kluver M.D.   On: 06/03/2020 17:25    Procedures Procedures (including critical care time)  Medications Ordered in ED Medications - No data to display  ED Course  I have reviewed the triage vital signs and the nursing notes.  Pertinent labs & imaging results that were available during my care of the patient were reviewed by me and considered in my medical decision making (see chart for details).    MDM Rules/Calculators/A&P                          Patient presents with left ankle pain.  Suspect this is connected with her long hours spent walking on hard floor.  May be an issue with her footwear. States she would like a note to limit weightbearing. Orthopedics versus podiatry follow-up. The patient was given instructions for home care as well as return precautions. Patient voices understanding of these instructions, accepts the plan, and is comfortable with discharge.  I reviewed and interpreted the patient's radiological studies.   Final Clinical Impression(s) / ED Diagnoses Final diagnoses:  Acute left ankle pain    Rx / DC Orders ED Discharge Orders         Ordered    diclofenac Sodium (VOLTAREN) 1 % GEL  4 times daily        06/03/20 2207           Concepcion Living 06/03/20 2305    Wynetta Fines, MD 06/06/20 1104

## 2020-06-03 NOTE — ED Triage Notes (Signed)
C/o L ankle pain.  States pain started while at work last Friday. Denies fall or any injury.  Pt wearing ASO.

## 2020-09-19 LAB — OB RESULTS CONSOLE RPR: RPR: NONREACTIVE

## 2020-09-19 LAB — OB RESULTS CONSOLE ANTIBODY SCREEN: Antibody Screen: NEGATIVE

## 2020-09-19 LAB — OB RESULTS CONSOLE HIV ANTIBODY (ROUTINE TESTING): HIV: NONREACTIVE

## 2020-09-19 LAB — OB RESULTS CONSOLE ABO/RH: RH Type: POSITIVE

## 2020-09-19 LAB — OB RESULTS CONSOLE RUBELLA ANTIBODY, IGM: Rubella: IMMUNE

## 2020-09-19 LAB — OB RESULTS CONSOLE HEPATITIS B SURFACE ANTIGEN: Hepatitis B Surface Ag: NEGATIVE

## 2021-01-01 ENCOUNTER — Other Ambulatory Visit: Payer: Self-pay

## 2021-01-01 ENCOUNTER — Encounter (HOSPITAL_COMMUNITY): Payer: Self-pay | Admitting: Obstetrics and Gynecology

## 2021-01-01 ENCOUNTER — Inpatient Hospital Stay (HOSPITAL_COMMUNITY)
Admission: AD | Admit: 2021-01-01 | Discharge: 2021-01-01 | Disposition: A | Discharge status: Home / Self Care | Payer: Medicaid Other | Attending: Obstetrics and Gynecology | Admitting: Obstetrics and Gynecology

## 2021-01-01 DIAGNOSIS — K644 Residual hemorrhoidal skin tags: Secondary | ICD-10-CM

## 2021-01-01 DIAGNOSIS — O2242 Hemorrhoids in pregnancy, second trimester: Secondary | ICD-10-CM | POA: Diagnosis not present

## 2021-01-01 DIAGNOSIS — Z3A24 24 weeks gestation of pregnancy: Secondary | ICD-10-CM | POA: Diagnosis not present

## 2021-01-01 DIAGNOSIS — Z3689 Encounter for other specified antenatal screening: Secondary | ICD-10-CM

## 2021-01-01 LAB — WET PREP, GENITAL
Clue Cells Wet Prep HPF POC: NONE SEEN
Sperm: NONE SEEN
Trich, Wet Prep: NONE SEEN
Yeast Wet Prep HPF POC: NONE SEEN

## 2021-01-01 NOTE — MAU Provider Note (Signed)
History     CSN: [id number]  Arrival date and time: 01/01/21 1910   Event Date/Time   First Provider Initiated Contact with Patient 01/01/21 2047      No chief complaint on file.  Ms. Leah Cox is a 30 y.o. G2P1001 at [redacted]w[redacted]d who presents to MAU for vaginal bleeding which began around 5PM this evening. Patient reports she went to the bathroom to urinate and saw some streak of blood in the toilet. Patient reports she then put on a panty liner and saw it was covered with blood. Patient then reports she went to the bathroom to have a bowel movement and saw the blood when wiping again after, but it looked "older, like when I am finishing my period." Patient denies any bleeding after using the restroom in MAU upon arrival.  Passing blood clots? no Blood soaking clothes? no Lightheaded/dizzy? no Significant pelvic pain or cramping/ctx? no Passed any tissue? no   OB History    Gravida  2   Para  1   Term  1   Preterm      AB      Living  1     SAB      IAB      Ectopic      Multiple  0   Live Births  1           Past Medical History:  Diagnosis Date  . Allergy    seasonal  . Medical history non-contributory     Past Surgical History:  Procedure Laterality Date  . CESAREAN SECTION N/A 09/07/2019   Procedure: CESAREAN SECTION;  Surgeon: Harold Hedge, MD;  Location: [facility] LD [facility]S;  Service: Obstetrics;  Laterality: N/A;  . NO PAST SURGERIES      Family History  Problem Relation Age of Onset  . Hypertension Mother   . Diabetes Sister        Insulin dependent    Social History   Tobacco Use  . Smoking status: Never Smoker  . Smokeless tobacco: Never Used  Vaping Use  . Vaping Use: Never used  Substance Use Topics  . Alcohol use: No  . Drug use: No    Allergies:  Allergies  Allergen Reactions  . Codeine Swelling  . Latex   . Nickel Rash    Medications Prior to Admission  Medication Sig Dispense Refill Last Dose  . acetaminophen  (TYLENOL) 325 MG tablet Take 2 tablets (650 mg total) by mouth every 4 (four) hours as needed for mild pain (temperature > 101.5.). 30 tablet 0 Past Week at Unknown time  . Prenatal Vit-Fe Fumarate-FA (PRENATAL MULTIVITAMIN) TABS tablet Take 1 tablet by mouth daily at 12 noon.   01/01/2021 at Unknown time  . cetirizine (ZYRTEC) 10 MG tablet Take 10 mg by mouth daily.     . diclofenac Sodium (VOLTAREN) 1 % GEL Apply 4 g topically 4 (four) times daily. 100 g 2   . HYDROmorphone (DILAUDID) 2 MG tablet Take 1 tablet (2 mg total) by mouth every 4 (four) hours as needed for severe pain (severe pain). 30 tablet 0   . ibuprofen (ADVIL,MOTRIN) 800 MG tablet Take 1 tablet (800 mg total) by mouth 3 (three) times daily. 21 tablet 0     Review of Systems  Constitutional: Negative for chills, diaphoresis, fatigue and fever.  Eyes: Negative for visual disturbance.  Respiratory: Negative for shortness of breath.   Cardiovascular: Negative for chest pain.  Gastrointestinal: Negative for abdominal  pain, constipation, diarrhea, nausea and vomiting.  Genitourinary: Positive for vaginal bleeding. Negative for dysuria, flank pain, frequency, pelvic pain, urgency and vaginal discharge.  Neurological: Negative for dizziness, weakness, light-headedness and headaches.   Physical Exam   Blood pressure 122/67, pulse 79, temperature 98.3 F (36.8 C), temperature source Oral, resp. rate 20, height 5\' 4"  (1.626 m), weight 97.9 kg, last menstrual period 07/17/2020, unknown if currently breastfeeding.  Patient Vitals for the past 24 hrs:  BP Temp Temp src Pulse Resp Height Weight  01/01/21 1944 122/67 98.3 F (36.8 C) Oral 79 20 5\' 4"  (1.626 m) 97.9 kg   Physical Exam Vitals and nursing note reviewed. Exam conducted with a chaperone present.  Constitutional:      General: She is not in acute distress.    Appearance: Normal appearance. She is not ill-appearing, toxic-appearing or diaphoretic.  HENT:     Head:  Normocephalic and atraumatic.  Pulmonary:     Effort: Pulmonary effort is normal.  Genitourinary:    General: Normal vulva.     Labia:        Right: No rash, tenderness or lesion.        Left: No rash, tenderness or lesion.      Vagina: No bleeding.     Cervix: No discharge, lesion, erythema or cervical bleeding.     Rectum: External hemorrhoid present.     Comments: Small drops of bright red blood present on either side of rectum. External hemorrhoids examined, no active bleeding found. No bleeding on speculum exam, smooth, white discharge present without brown/red discoloration. Cervix visually closed. Skin:    General: Skin is warm and dry.  Neurological:     Mental Status: She is alert and oriented to person, place, and time.  Psychiatric:        Mood and Affect: Mood normal.        Behavior: Behavior normal.        Thought Content: Thought content normal.        Judgment: Judgment normal.    Results for orders placed or performed during the hospital encounter of 01/01/21 (from the past 24 hour(s))  Wet prep, genital     Status: Abnormal   Collection Time: 01/01/21  9:15 PM   Specimen: Vaginal  Result Value Ref Range   Yeast Wet Prep HPF POC NONE SEEN NONE SEEN   Trich, Wet Prep NONE SEEN NONE SEEN   Clue Cells Wet Prep HPF POC NONE SEEN NONE SEEN   WBC, Wet Prep HPF POC MANY (A) NONE SEEN   Sperm NONE SEEN     No results found.  MAU Course  Procedures  MDM -VB per pt report, no VB on speculum exam -small drops of bright red blood noted next to rectum with external hemorrhoids -given short time span between report of bleeding and exam in MAU without evidence of vaginal bleeding, suspect hemorrhoids -EFM: reassuring, reviewed with Dr. 01/03/21       -baseline: 145       -variability: moderate       -accels: present, single 10x10       -decels: absent       -TOCO: quiet -WetPrep: WNL -GC/CT collected -pt discharged to home in stable condition  Orders Placed This  Encounter  Procedures  . Wet prep, genital    Standing Status:   Standing    Number of Occurrences:   1  . Discharge patient    Order Specific Question:  Discharge disposition    Answer:   01-Home or Self Care [1]    Order Specific Question:   Discharge patient date    Answer:   01/01/2021   No orders of the defined types were placed in this encounter.  Assessment and Plan   1. Bleeding external hemorrhoids   2. [redacted] weeks gestation of pregnancy   3. NST (non-stress test) reactive    Allergies as of 01/01/2021      Reactions   Codeine Swelling   Latex    Nickel Rash      Medication List    TAKE these medications   acetaminophen 325 MG tablet Commonly known as: TYLENOL Take 2 tablets (650 mg total) by mouth every 4 (four) hours as needed for mild pain (temperature > 101.5.).   cetirizine 10 MG tablet Commonly known as: ZYRTEC Take 10 mg by mouth daily.   diclofenac Sodium 1 % Gel Commonly known as: VOLTAREN Apply 4 g topically 4 (four) times daily.   HYDROmorphone 2 MG tablet Commonly known as: DILAUDID Take 1 tablet (2 mg total) by mouth every 4 (four) hours as needed for severe pain (severe pain).   ibuprofen 800 MG tablet Commonly known as: ADVIL Take 1 tablet (800 mg total) by mouth 3 (three) times daily.   prenatal multivitamin Tabs tablet Take 1 tablet by mouth daily at 12 noon.      -will call with culture results, if positive -pt advised to f/u with PCP for hemorrhoid management -return MAU precautions given -pt discharged to home in stable condition  Joni Reining E Dudley Mages 01/01/2021, 9:35 PM

## 2021-01-01 NOTE — Discharge Instructions (Signed)

## 2021-01-01 NOTE — MAU Note (Signed)
PT SAYS SHE GETS [facility] WITH [facility]- DR  GLASSER  SAYS STARTED VAG BLEEDING AT 5PM- SAW IN TOILET.       THEN AT 530- WAS ON PL AND WHEN WIPED - CALLED OFFICE - TOLD TO COME HERE.   IN TRIAGE - NOTHING ON PL.   LAST SEX- LAST Thursday .  NO PAIN .

## 2021-01-02 LAB — GC/CHLAMYDIA PROBE AMP (~~LOC~~) NOT AT ARMC
Chlamydia: NEGATIVE
Comment: NEGATIVE
Comment: NORMAL
Neisseria Gonorrhea: NEGATIVE

## 2021-01-24 DIAGNOSIS — Z3A27 27 weeks gestation of pregnancy: Secondary | ICD-10-CM | POA: Diagnosis not present

## 2021-01-24 DIAGNOSIS — Z3400 Encounter for supervision of normal first pregnancy, unspecified trimester: Secondary | ICD-10-CM | POA: Diagnosis not present

## 2021-01-24 DIAGNOSIS — Z23 Encounter for immunization: Secondary | ICD-10-CM | POA: Diagnosis not present

## 2021-01-31 DIAGNOSIS — O99810 Abnormal glucose complicating pregnancy: Secondary | ICD-10-CM | POA: Diagnosis not present

## 2021-02-08 DIAGNOSIS — Z3482 Encounter for supervision of other normal pregnancy, second trimester: Secondary | ICD-10-CM | POA: Diagnosis not present

## 2021-03-01 DIAGNOSIS — Z3400 Encounter for supervision of normal first pregnancy, unspecified trimester: Secondary | ICD-10-CM | POA: Diagnosis not present

## 2021-03-01 DIAGNOSIS — Z113 Encounter for screening for infections with a predominantly sexual mode of transmission: Secondary | ICD-10-CM | POA: Diagnosis not present

## 2021-03-01 DIAGNOSIS — N760 Acute vaginitis: Secondary | ICD-10-CM | POA: Diagnosis not present

## 2021-03-28 DIAGNOSIS — O321XX9 Maternal care for breech presentation, other fetus: Secondary | ICD-10-CM | POA: Diagnosis not present

## 2021-03-28 DIAGNOSIS — Z3A36 36 weeks gestation of pregnancy: Secondary | ICD-10-CM | POA: Diagnosis not present

## 2021-03-28 DIAGNOSIS — Z3682 Encounter for antenatal screening for nuchal translucency: Secondary | ICD-10-CM | POA: Diagnosis not present

## 2021-04-02 ENCOUNTER — Other Ambulatory Visit: Payer: Self-pay | Admitting: Obstetrics and Gynecology

## 2021-04-02 ENCOUNTER — Other Ambulatory Visit: Payer: Self-pay

## 2021-04-02 DIAGNOSIS — Z363 Encounter for antenatal screening for malformations: Secondary | ICD-10-CM

## 2021-04-02 DIAGNOSIS — O283 Abnormal ultrasonic finding on antenatal screening of mother: Secondary | ICD-10-CM

## 2021-04-04 ENCOUNTER — Other Ambulatory Visit: Payer: Self-pay

## 2021-04-04 ENCOUNTER — Ambulatory Visit: Payer: BLUE CROSS/BLUE SHIELD | Admitting: *Deleted

## 2021-04-04 ENCOUNTER — Ambulatory Visit: Payer: BLUE CROSS/BLUE SHIELD | Attending: Obstetrics and Gynecology

## 2021-04-04 ENCOUNTER — Encounter: Payer: Self-pay | Admitting: *Deleted

## 2021-04-04 VITALS — BP 118/76 | HR 87

## 2021-04-04 DIAGNOSIS — Z98891 History of uterine scar from previous surgery: Secondary | ICD-10-CM | POA: Diagnosis present

## 2021-04-04 DIAGNOSIS — O283 Abnormal ultrasonic finding on antenatal screening of mother: Secondary | ICD-10-CM

## 2021-04-04 DIAGNOSIS — Z363 Encounter for antenatal screening for malformations: Secondary | ICD-10-CM | POA: Insufficient documentation

## 2021-04-10 ENCOUNTER — Telehealth (HOSPITAL_COMMUNITY): Payer: Self-pay | Admitting: *Deleted

## 2021-04-10 NOTE — Telephone Encounter (Signed)
Preadmission screen  

## 2021-04-10 NOTE — Patient Instructions (Signed)
Leah Cox  04/10/2021   Your procedure is scheduled on:  04/19/2021  Arrive at 0530 at [employer] [facility] on [address] at [facility]  and [employer]. You are invited to use the FREE valet parking or use the Visitor's parking deck.  Pick up the phone at the desk and dial [phone].  Call this number if you have problems the morning of surgery: [phone]  Remember:   Do not eat food:(After Midnight) Desps de medianoche.  Do not drink clear liquids: (After Midnight) Desps de medianoche.  Take these medicines the morning of surgery with A SIP OF WATER:  none   Do not wear jewelry, make-up or nail polish.  Do not wear lotions, powders, or perfumes. Do not wear deodorant.  Do not shave 48 hours prior to surgery.  Do not bring valuables to the hospital.  [facility] is not   responsible for any belongings or valuables brought to the hospital.  Contacts, dentures or bridgework may not be worn into surgery.  Leave suitcase in the car. After surgery it may be brought to your room.  For patients admitted to the hospital, checkout time is 11:00 AM the day of              discharge.      Please read over the following fact sheets that you were given:     Preparing for Surgery

## 2021-04-11 DIAGNOSIS — N760 Acute vaginitis: Secondary | ICD-10-CM | POA: Diagnosis not present

## 2021-04-11 DIAGNOSIS — Z3400 Encounter for supervision of normal first pregnancy, unspecified trimester: Secondary | ICD-10-CM | POA: Diagnosis not present

## 2021-04-13 ENCOUNTER — Encounter (HOSPITAL_COMMUNITY): Payer: Self-pay

## 2021-04-17 ENCOUNTER — Other Ambulatory Visit: Payer: Self-pay

## 2021-04-17 ENCOUNTER — Other Ambulatory Visit (HOSPITAL_COMMUNITY)
Admission: RE | Admit: 2021-04-17 | Discharge: 2021-04-17 | Disposition: A | Discharge status: Home / Self Care | Payer: BLUE CROSS/BLUE SHIELD | Source: Ambulatory Visit | Attending: Obstetrics and Gynecology | Admitting: Obstetrics and Gynecology

## 2021-04-17 ENCOUNTER — Encounter (HOSPITAL_COMMUNITY)
Admission: RE | Admit: 2021-04-17 | Discharge: 2021-04-17 | Disposition: A | Discharge status: Home / Self Care | Payer: BLUE CROSS/BLUE SHIELD | Source: Ambulatory Visit | Attending: Obstetrics and Gynecology | Admitting: Obstetrics and Gynecology

## 2021-04-17 DIAGNOSIS — Z20822 Contact with and (suspected) exposure to covid-19: Secondary | ICD-10-CM | POA: Insufficient documentation

## 2021-04-17 DIAGNOSIS — Z01812 Encounter for preprocedural laboratory examination: Secondary | ICD-10-CM | POA: Diagnosis not present

## 2021-04-17 LAB — SARS CORONAVIRUS 2 (TAT 6-24 HRS): SARS Coronavirus 2: NEGATIVE

## 2021-04-17 LAB — TYPE AND SCREEN
ABO/RH(D): O POS
Antibody Screen: NEGATIVE

## 2021-04-17 LAB — CBC
HCT: 37.2 % (ref 36.0–46.0)
Hemoglobin: 11.8 g/dL — ABNORMAL LOW (ref 12.0–15.0)
MCH: 22.8 pg — ABNORMAL LOW (ref 26.0–34.0)
MCHC: 31.7 g/dL (ref 30.0–36.0)
MCV: 72 fL — ABNORMAL LOW (ref 80.0–100.0)
Platelets: 175 10*3/uL (ref 150–400)
RBC: 5.17 MIL/uL — ABNORMAL HIGH (ref 3.87–5.11)
RDW: 16.8 % — ABNORMAL HIGH (ref 11.5–15.5)
WBC: 8.5 10*3/uL (ref 4.0–10.5)
nRBC: 0 % (ref 0.0–0.2)

## 2021-04-18 LAB — RPR: RPR Ser Ql: NONREACTIVE

## 2021-04-18 NOTE — H&P (Signed)
Leah Cox is a 30 y.o. female presenting for repeat C/S. 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 They recommended no further WU.  OB History     Gravida  2   Para  1   Term  1   Preterm      AB      Living  1      SAB      IAB      Ectopic      Multiple  0   Live Births  1          Past Medical History:  Diagnosis Date   Allergy    seasonal   Anemia    HSV (herpes simplex virus) infection    Vaginal Pap smear, abnormal    Past Surgical History:  Procedure Laterality Date   CESAREAN SECTION N/A 09/07/2019   Procedure: CESAREAN SECTION;  Surgeon: Harold Hedge, MD;  Location: [facility] LD ORS;  Service: Obstetrics;  Laterality: N/A;   Family History: family history includes Diabetes in her sister; Hypertension in her mother. Social History:  reports that she has never smoked. She has never used smokeless tobacco. She reports that she does not drink alcohol and does not use drugs.     Maternal Diabetes: No Genetic Screening: Normal Maternal Ultrasounds/Referrals: Other:3%-6% head circumference Fetal Ultrasounds or other Referrals:  Referred to Materal Fetal Medicine  Maternal Substance Abuse:  No Significant Maternal Medications:  None Significant Maternal Lab Results:  Group B Strep negative Other Comments:  None  Review of Systems  Constitutional:  Negative for fever.  Eyes:  Negative for visual disturbance.  Gastrointestinal:  Negative for abdominal pain.  Neurological:  Negative for headaches.  Maternal Medical History:  Fetal activity: Perceived fetal activity is normal.      Last menstrual period 07/17/2020, unknown if currently breastfeeding. Maternal Exam:  Abdomen: Fetal presentation: vertex  Physical Exam Cardiovascular:     Rate and Rhythm: Normal rate.  Pulmonary:      Effort: Pulmonary effort is normal.    Prenatal labs: ABO, Rh: --/--/O POS (07/12 0914) Antibody: NEG (07/12 0914) Rubella: Immune (12/14 0000) RPR: NON REACTIVE (07/12 0930)  HBsAg: Negative (12/14 0000)  HIV: Non-reactive (12/14 0000)  GBS:   negative 03/28/21  Assessment/Plan: 30 yo G2P1 @ 39 3/7 wks for repeat C/S   Leah Cox II 04/18/2021, 6:16 PM

## 2021-04-18 NOTE — Anesthesia Preprocedure Evaluation (Addendum)
Anesthesia Evaluation  Patient identified by MRN, date of birth, ID band Patient awake    Reviewed: Allergy & Precautions, NPO status , Patient's Chart, lab work & pertinent test results  Airway Mallampati: III  TM Distance: >3 FB Neck ROM: Full    Dental no notable dental hx. (+) Dental Advisory Given, Teeth Intact   Pulmonary neg pulmonary ROS,    Pulmonary exam normal breath sounds clear to auscultation       Cardiovascular negative cardio ROS Normal cardiovascular exam Rhythm:Regular Rate:Normal     Neuro/Psych negative neurological ROS  negative psych ROS   GI/Hepatic negative GI ROS, Neg liver ROS,   Endo/Other    Renal/GU negative Renal ROS     Musculoskeletal negative musculoskeletal ROS (+)   Abdominal (+) + obese,   Peds  Hematology negative hematology ROS (+) anemia ,   Anesthesia Other Findings   Reproductive/Obstetrics (+) Pregnancy                           Anesthesia Physical  Anesthesia Plan  ASA: 2  Anesthesia Plan: Spinal   Post-op Pain Management:    Induction:   PONV Risk Score and Plan: 4 or greater and Treatment may vary due to age or medical condition, Ondansetron, Dexamethasone and Scopolamine patch - Pre-op  Airway Management Planned: Natural Airway  Additional Equipment: None  Intra-op Plan:   Post-operative Plan:   Informed Consent: I have reviewed the patients History and Physical, chart, labs and discussed the procedure including the risks, benefits and alternatives for the proposed anesthesia with the patient or authorized representative who has indicated his/her understanding and acceptance.     Dental advisory given  Plan Discussed with: CRNA  Anesthesia Plan Comments:        Anesthesia Quick Evaluation

## 2021-04-19 ENCOUNTER — Inpatient Hospital Stay (HOSPITAL_COMMUNITY): Payer: Medicaid Other | Admitting: Anesthesiology

## 2021-04-19 ENCOUNTER — Encounter (HOSPITAL_COMMUNITY)
Admission: RE | Disposition: A | Discharge status: Home / Self Care | Payer: Self-pay | Source: Home / Self Care | Attending: Obstetrics and Gynecology

## 2021-04-19 ENCOUNTER — Inpatient Hospital Stay (HOSPITAL_COMMUNITY)
Admission: RE | Admit: 2021-04-19 | Discharge: 2021-04-21 | DRG: 784 | Disposition: A | Discharge status: Home / Self Care | Payer: Medicaid Other | Attending: Obstetrics and Gynecology | Admitting: Obstetrics and Gynecology

## 2021-04-19 ENCOUNTER — Other Ambulatory Visit: Payer: Self-pay

## 2021-04-19 ENCOUNTER — Encounter (HOSPITAL_COMMUNITY): Payer: Self-pay | Admitting: Obstetrics and Gynecology

## 2021-04-19 DIAGNOSIS — O321XX0 Maternal care for breech presentation, not applicable or unspecified: Secondary | ICD-10-CM | POA: Diagnosis present

## 2021-04-19 DIAGNOSIS — N83202 Unspecified ovarian cyst, left side: Secondary | ICD-10-CM | POA: Diagnosis present

## 2021-04-19 DIAGNOSIS — Z3A39 39 weeks gestation of pregnancy: Secondary | ICD-10-CM | POA: Diagnosis not present

## 2021-04-19 DIAGNOSIS — Z302 Encounter for sterilization: Secondary | ICD-10-CM | POA: Diagnosis not present

## 2021-04-19 DIAGNOSIS — O3483 Maternal care for other abnormalities of pelvic organs, third trimester: Secondary | ICD-10-CM | POA: Diagnosis present

## 2021-04-19 DIAGNOSIS — A6000 Herpesviral infection of urogenital system, unspecified: Secondary | ICD-10-CM | POA: Diagnosis present

## 2021-04-19 DIAGNOSIS — O34211 Maternal care for low transverse scar from previous cesarean delivery: Secondary | ICD-10-CM | POA: Diagnosis not present

## 2021-04-19 DIAGNOSIS — Z98891 History of uterine scar from previous surgery: Secondary | ICD-10-CM

## 2021-04-19 DIAGNOSIS — O9832 Other infections with a predominantly sexual mode of transmission complicating childbirth: Secondary | ICD-10-CM | POA: Diagnosis present

## 2021-04-19 SURGERY — Surgical Case
Anesthesia: Spinal

## 2021-04-19 MED ORDER — MENTHOL 3 MG MT LOZG: 1.0000 | LOZENGE | OROMUCOSAL | Status: DC | PRN

## 2021-04-19 MED ORDER — SODIUM CHLORIDE 0.9 % IV SOLN: INTRAVENOUS | Status: DC | PRN

## 2021-04-19 MED ORDER — ZOLPIDEM TARTRATE 5 MG PO TABS: 5.0000 mg | ORAL_TABLET | Freq: Every evening | ORAL | Status: DC | PRN

## 2021-04-19 MED ORDER — DIPHENHYDRAMINE HCL 25 MG PO CAPS: 25.0000 mg | ORAL_CAPSULE | ORAL | Status: DC | PRN

## 2021-04-19 MED ORDER — OXYTOCIN-SODIUM CHLORIDE 30-0.9 UT/500ML-% IV SOLN
INTRAVENOUS | Status: DC | PRN
  Administered 2021-04-19: 30 [IU] via INTRAVENOUS

## 2021-04-19 MED ORDER — TETANUS-DIPHTH-ACELL PERTUSSIS 5-2.5-18.5 LF-MCG/0.5 IM SUSY: 0.5000 mL | PREFILLED_SYRINGE | Freq: Once | INTRAMUSCULAR | Status: DC

## 2021-04-19 MED ORDER — NALBUPHINE HCL 10 MG/ML IJ SOLN
5.0000 mg | INTRAMUSCULAR | Status: DC | PRN
Start: 2021-04-19 — End: 2021-04-21

## 2021-04-19 MED ORDER — HYDROMORPHONE HCL 1 MG/ML IJ SOLN: 0.2000 mg | INTRAMUSCULAR | Status: DC | PRN

## 2021-04-19 MED ORDER — MORPHINE SULFATE (PF) 0.5 MG/ML IJ SOLN
INTRAMUSCULAR | Status: AC
  Filled 2021-04-19: qty 10

## 2021-04-19 MED ORDER — CEFAZOLIN SODIUM-DEXTROSE 2-4 GM/100ML-% IV SOLN
INTRAVENOUS | Status: AC
  Filled 2021-04-19: qty 100

## 2021-04-19 MED ORDER — NALOXONE HCL 4 MG/10ML IJ SOLN
1.0000 ug/kg/h | INTRAVENOUS | Status: DC | PRN
  Filled 2021-04-19: qty 5

## 2021-04-19 MED ORDER — PROMETHAZINE HCL 25 MG/ML IJ SOLN: 6.2500 mg | INTRAMUSCULAR | Status: DC | PRN

## 2021-04-19 MED ORDER — DIPHENHYDRAMINE HCL 50 MG/ML IJ SOLN: 12.5000 mg | INTRAMUSCULAR | Status: DC | PRN

## 2021-04-19 MED ORDER — DIBUCAINE (PERIANAL) 1 % EX OINT: 1.0000 "application " | TOPICAL_OINTMENT | CUTANEOUS | Status: DC | PRN

## 2021-04-19 MED ORDER — NALBUPHINE HCL 10 MG/ML IJ SOLN
5.0000 mg | Freq: Once | INTRAMUSCULAR | Status: DC | PRN
Start: 2021-04-19 — End: 2021-04-21

## 2021-04-19 MED ORDER — BUPIVACAINE IN DEXTROSE 0.75-8.25 % IT SOLN
INTRATHECAL | Status: DC | PRN
  Administered 2021-04-19: 1.5 mL via INTRATHECAL

## 2021-04-19 MED ORDER — NALOXONE HCL 0.4 MG/ML IJ SOLN: 0.4000 mg | INTRAMUSCULAR | Status: DC | PRN

## 2021-04-19 MED ORDER — TRAMADOL HCL 50 MG PO TABS
50.0000 mg | ORAL_TABLET | Freq: Four times a day (QID) | ORAL | Status: DC | PRN
  Administered 2021-04-20 – 2021-04-21 (×2): 50 mg via ORAL
  Filled 2021-04-19 (×2): qty 1

## 2021-04-19 MED ORDER — HYDROMORPHONE HCL 1 MG/ML IJ SOLN: 0.2500 mg | INTRAMUSCULAR | Status: DC | PRN

## 2021-04-19 MED ORDER — NALBUPHINE HCL 10 MG/ML IJ SOLN: 5.0000 mg | Freq: Once | INTRAMUSCULAR | Status: DC | PRN

## 2021-04-19 MED ORDER — MORPHINE SULFATE (PF) 0.5 MG/ML IJ SOLN
INTRAMUSCULAR | Status: DC | PRN
  Administered 2021-04-19: 150 ug via INTRATHECAL

## 2021-04-19 MED ORDER — SOD CITRATE-CITRIC ACID 500-334 MG/5ML PO SOLN: 30.0000 mL | ORAL | Status: DC

## 2021-04-19 MED ORDER — PRENATAL MULTIVITAMIN CH
1.0000 | ORAL_TABLET | Freq: Every day | ORAL | Status: DC
  Administered 2021-04-19 – 2021-04-21 (×3): 1 via ORAL
  Filled 2021-04-19 (×3): qty 1

## 2021-04-19 MED ORDER — COCONUT OIL OIL: 1.0000 "application " | TOPICAL_OIL | Status: DC | PRN

## 2021-04-19 MED ORDER — SCOPOLAMINE 1 MG/3DAYS TD PT72
MEDICATED_PATCH | TRANSDERMAL | Status: AC
  Filled 2021-04-19: qty 1

## 2021-04-19 MED ORDER — STERILE WATER FOR IRRIGATION IR SOLN
Status: DC | PRN
  Administered 2021-04-19: 1

## 2021-04-19 MED ORDER — MEPERIDINE HCL 25 MG/ML IJ SOLN: 6.2500 mg | INTRAMUSCULAR | Status: DC | PRN

## 2021-04-19 MED ORDER — WITCH HAZEL-GLYCERIN EX PADS: 1.0000 "application " | MEDICATED_PAD | CUTANEOUS | Status: DC | PRN

## 2021-04-19 MED ORDER — SODIUM CHLORIDE 0.9% FLUSH: 3.0000 mL | INTRAVENOUS | Status: DC | PRN

## 2021-04-19 MED ORDER — SIMETHICONE 80 MG PO CHEW
80.0000 mg | CHEWABLE_TABLET | Freq: Three times a day (TID) | ORAL | Status: DC
  Administered 2021-04-19 – 2021-04-21 (×6): 80 mg via ORAL
  Filled 2021-04-19 (×6): qty 1

## 2021-04-19 MED ORDER — DEXAMETHASONE SODIUM PHOSPHATE 10 MG/ML IJ SOLN
INTRAMUSCULAR | Status: DC | PRN
  Administered 2021-04-19: 10 mg via INTRAVENOUS

## 2021-04-19 MED ORDER — KETOROLAC TROMETHAMINE 30 MG/ML IJ SOLN
30.0000 mg | Freq: Once | INTRAMUSCULAR | Status: AC | PRN
  Administered 2021-04-19: 30 mg via INTRAVENOUS

## 2021-04-19 MED ORDER — FENTANYL CITRATE (PF) 100 MCG/2ML IJ SOLN
INTRAMUSCULAR | Status: DC | PRN
  Administered 2021-04-19: 15 ug via INTRATHECAL

## 2021-04-19 MED ORDER — OXYTOCIN-SODIUM CHLORIDE 30-0.9 UT/500ML-% IV SOLN
INTRAVENOUS | Status: AC
  Filled 2021-04-19: qty 500

## 2021-04-19 MED ORDER — ONDANSETRON HCL 4 MG/2ML IJ SOLN
INTRAMUSCULAR | Status: DC | PRN
  Administered 2021-04-19: 4 mg via INTRAVENOUS

## 2021-04-19 MED ORDER — SCOPOLAMINE 1 MG/3DAYS TD PT72
1.0000 | MEDICATED_PATCH | Freq: Once | TRANSDERMAL | Status: DC
  Administered 2021-04-19: 1.5 mg via TRANSDERMAL

## 2021-04-19 MED ORDER — TRAMADOL HCL 50 MG PO TABS: 50.0000 mg | ORAL_TABLET | Freq: Four times a day (QID) | ORAL | Status: DC | PRN

## 2021-04-19 MED ORDER — ACETAMINOPHEN 500 MG PO TABS
1000.0000 mg | ORAL_TABLET | Freq: Four times a day (QID) | ORAL | Status: DC | PRN
  Administered 2021-04-19 – 2021-04-21 (×7): 1000 mg via ORAL
  Filled 2021-04-19 (×7): qty 2

## 2021-04-19 MED ORDER — IBUPROFEN 600 MG PO TABS
600.0000 mg | ORAL_TABLET | Freq: Four times a day (QID) | ORAL | Status: DC | PRN
  Administered 2021-04-19 – 2021-04-21 (×8): 600 mg via ORAL
  Filled 2021-04-19 (×8): qty 1

## 2021-04-19 MED ORDER — LACTATED RINGERS IV SOLN: INTRAVENOUS | Status: DC

## 2021-04-19 MED ORDER — PHENYLEPHRINE HCL-NACL 20-0.9 MG/250ML-% IV SOLN
INTRAVENOUS | Status: DC | PRN
  Administered 2021-04-19: 60 ug/min via INTRAVENOUS

## 2021-04-19 MED ORDER — ONDANSETRON HCL 4 MG/2ML IJ SOLN
INTRAMUSCULAR | Status: AC
  Filled 2021-04-19: qty 2

## 2021-04-19 MED ORDER — DIPHENHYDRAMINE HCL 25 MG PO CAPS: 25.0000 mg | ORAL_CAPSULE | Freq: Four times a day (QID) | ORAL | Status: DC | PRN

## 2021-04-19 MED ORDER — SENNOSIDES-DOCUSATE SODIUM 8.6-50 MG PO TABS
2.0000 | ORAL_TABLET | Freq: Every day | ORAL | Status: DC
  Administered 2021-04-20 – 2021-04-21 (×2): 2 via ORAL
  Filled 2021-04-19 (×2): qty 2

## 2021-04-19 MED ORDER — SIMETHICONE 80 MG PO CHEW: 80.0000 mg | CHEWABLE_TABLET | ORAL | Status: DC | PRN

## 2021-04-19 MED ORDER — FENTANYL CITRATE (PF) 100 MCG/2ML IJ SOLN
INTRAMUSCULAR | Status: AC
  Filled 2021-04-19: qty 2

## 2021-04-19 MED ORDER — ONDANSETRON HCL 4 MG/2ML IJ SOLN: 4.0000 mg | Freq: Three times a day (TID) | INTRAMUSCULAR | Status: DC | PRN

## 2021-04-19 MED ORDER — CEFAZOLIN SODIUM-DEXTROSE 2-4 GM/100ML-% IV SOLN: 2.0000 g | INTRAVENOUS | Status: DC

## 2021-04-19 MED ORDER — POVIDONE-IODINE 10 % EX SWAB: 2.0000 "application " | Freq: Once | CUTANEOUS | Status: DC

## 2021-04-19 MED ORDER — KETOROLAC TROMETHAMINE 30 MG/ML IJ SOLN
INTRAMUSCULAR | Status: AC
  Filled 2021-04-19: qty 1

## 2021-04-19 MED ORDER — SODIUM CHLORIDE 0.9 % IR SOLN
Status: DC | PRN
  Administered 2021-04-19: 1

## 2021-04-19 MED ORDER — OXYTOCIN-SODIUM CHLORIDE 30-0.9 UT/500ML-% IV SOLN
2.5000 [IU]/h | INTRAVENOUS | Status: AC
  Administered 2021-04-19: 2.5 [IU]/h via INTRAVENOUS

## 2021-04-19 MED ORDER — CEFAZOLIN SODIUM-DEXTROSE 2-3 GM-%(50ML) IV SOLR
INTRAVENOUS | Status: DC | PRN
  Administered 2021-04-19: 2 g via INTRAVENOUS

## 2021-04-19 SURGICAL SUPPLY — 38 items
ADH SKN CLS APL DERMABOND .7 (GAUZE/BANDAGES/DRESSINGS)
APL SKNCLS STERI-STRIP NONHPOA (GAUZE/BANDAGES/DRESSINGS) ×1
BENZOIN TINCTURE PRP APPL 2/3 (GAUZE/BANDAGES/DRESSINGS) ×1 IMPLANT
CHLORAPREP W/TINT 26ML (MISCELLANEOUS) ×2 IMPLANT
CLAMP CORD UMBIL (MISCELLANEOUS) IMPLANT
CLOTH BEACON ORANGE TIMEOUT ST (SAFETY) ×2 IMPLANT
DERMABOND ADVANCED (GAUZE/BANDAGES/DRESSINGS)
DERMABOND ADVANCED .7 DNX12 (GAUZE/BANDAGES/DRESSINGS) IMPLANT
DRSG OPSITE POSTOP 4X10 (GAUZE/BANDAGES/DRESSINGS) ×2 IMPLANT
ELECT REM PT RETURN 9FT ADLT (ELECTROSURGICAL) ×2
ELECTRODE REM PT RTRN 9FT ADLT (ELECTROSURGICAL) ×1 IMPLANT
EXTRACTOR VACUUM M CUP 4 TUBE (SUCTIONS) IMPLANT
GAUZE SPONGE 4X4 12PLY STRL LF (GAUZE/BANDAGES/DRESSINGS) ×2 IMPLANT
GLOVE BIO SURGEON STRL SZ7.5 (GLOVE) ×2 IMPLANT
GLOVE BIOGEL PI IND STRL 7.0 (GLOVE) ×1 IMPLANT
GLOVE BIOGEL PI INDICATOR 7.0 (GLOVE) ×1
GOWN STRL REUS W/TWL LRG LVL3 (GOWN DISPOSABLE) ×4 IMPLANT
KIT ABG SYR 3ML LUER SLIP (SYRINGE) ×2 IMPLANT
NDL HYPO 25X5/8 SAFETYGLIDE (NEEDLE) ×1 IMPLANT
NEEDLE HYPO 25X5/8 SAFETYGLIDE (NEEDLE) ×2 IMPLANT
NS IRRIG 1000ML POUR BTL (IV SOLUTION) ×2 IMPLANT
PACK C SECTION WH (CUSTOM PROCEDURE TRAY) ×2 IMPLANT
PAD ABD DERMACEA PRESS 5X9 (GAUZE/BANDAGES/DRESSINGS) ×1 IMPLANT
PAD OB MATERNITY 4.3X12.25 (PERSONAL CARE ITEMS) ×2 IMPLANT
PENCIL SMOKE EVAC W/HOLSTER (ELECTROSURGICAL) ×2 IMPLANT
STRIP CLOSURE SKIN 1/2X4 (GAUZE/BANDAGES/DRESSINGS) ×1 IMPLANT
SUT CHROMIC 2 0 SH (SUTURE) ×1 IMPLANT
SUT MNCRL 0 VIOLET CTX 36 (SUTURE) ×4 IMPLANT
SUT MONOCRYL 0 CTX 36 (SUTURE) ×10
SUT PDS AB 0 CTX 60 (SUTURE) ×2 IMPLANT
SUT PLAIN 0 NONE (SUTURE) IMPLANT
SUT PLAIN 2 0 (SUTURE) ×2
SUT PLAIN 2 0 XLH (SUTURE) IMPLANT
SUT PLAIN ABS 2-0 CT1 27XMFL (SUTURE) IMPLANT
SUT VIC AB 4-0 KS 27 (SUTURE) ×2 IMPLANT
TOWEL OR 17X24 6PK STRL BLUE (TOWEL DISPOSABLE) ×2 IMPLANT
TRAY FOLEY W/BAG SLVR 14FR LF (SET/KITS/TRAYS/PACK) ×2 IMPLANT
WATER STERILE IRR 1000ML POUR (IV SOLUTION) ×2 IMPLANT

## 2021-04-19 NOTE — Transfer of Care (Signed)
Immediate Anesthesia Transfer of Care Note  Patient: Leah Cox  Procedure(s) Performed: REPEAT CESAREAN SECTION EDC: 04-23-21 PREVIOUS X 1  Patient Location: PACU  Anesthesia Type:Spinal  Level of Consciousness: awake, alert  and oriented  Airway & Oxygen Therapy: Patient Spontanous Breathing  Post-op Assessment: Report given to RN and Post -op Vital signs reviewed and stable  Post vital signs: Reviewed and stable  Last Vitals:  Vitals Value Taken Time  BP 108/64 04/19/21 0848  Temp    Pulse 75 04/19/21 0851  Resp 9 04/19/21 0851  SpO2 97 % 04/19/21 0851  Vitals shown include unvalidated device data.  Last Pain:  Vitals:   04/19/21 0548  TempSrc: Oral  PainSc: 0-No pain         Complications: No notable events documented.

## 2021-04-19 NOTE — Brief Op Note (Signed)
04/19/2021  8:36 AM  PATIENT:  Leah Cox  30 y.o. female  PRE-OPERATIVE DIAGNOSIS:  PREVIOUS X 1 Desires permanent sterilization  POST-OPERATIVE DIAGNOSIS:  previous C/S, desires permanent sterilization, left ovarian cyst  PROCEDURE:  repeat low transverse cesarean section, bilateral tubal ligation, left ovarian cystectomy  SURGEON:  Surgeon(s) and Role:    Harold Hedge, MD - Primary  PHYSICIAN ASSISTANT:   ASSISTANTS: none   ANESTHESIA:   spinal  EBL:  per anesthesiology note   BLOOD ADMINISTERED:none  DRAINS: Urinary Catheter (Foley)   LOCAL MEDICATIONS USED:  NONE  SPECIMEN:  Source of Specimen:  bilateral fallopian tube segments, left ovarian cyst  DISPOSITION OF SPECIMEN:  PATHOLOGY  COUNTS:  YES  TOURNIQUET:  * No tourniquets in log *  DICTATION: .Other Dictation: Dictation Number [id number]  PLAN OF CARE: Admit to inpatient   PATIENT DISPOSITION:  PACU - hemodynamically stable.   Delay start of Pharmacological VTE agent (>24hrs) due to surgical blood loss or risk of bleeding: not applicable

## 2021-04-19 NOTE — Anesthesia Procedure Notes (Addendum)
Spinal  Patient location during procedure: OR Start time: 04/19/2021 7:20 AM End time: 04/19/2021 7:27 AM Reason for block: surgical anesthesia Staffing Performed: anesthesiologist  Anesthesiologist: Nolon Nations, MD Preanesthetic Checklist Completed: patient identified, IV checked, site marked, risks and benefits discussed, surgical consent, monitors and equipment checked, pre-op evaluation and timeout performed Spinal Block Patient position: sitting Prep: DuraPrep and site prepped and draped Patient monitoring: heart rate, continuous pulse ox and blood pressure Approach: midline Location: L3-4 Injection technique: single-shot Needle Needle type: Spinocan  Needle gauge: 25 G Needle length: 9 cm Additional Notes Expiration date of kit checked and confirmed. Patient tolerated procedure well, without complications.

## 2021-04-19 NOTE — Op Note (Signed)
NAME: Leah Cox, KRONICK MEDICAL RECORD NO: [id number] ACCOUNT NO: [id number] DATE OF BIRTH: 10-Jul-1991 FACILITY: [facility] LOCATION: [facility] PHYSICIAN: Guy Sandifer. Arleta Creek, MD  Operative Report   DATE OF PROCEDURE: 04/19/2021  PREOPERATIVE DIAGNOSES: 1.  Previous cesarean section, desires repeat. 2.  Desires permanent sterilization.  POSTOPERATIVE DIAGNOSES:  1.  Previous cesarean section, desires repeat. 2.  Desires permanent sterilization. 3.  Left ovarian cyst.  PROCEDURE:  Repeat low transverse cesarean section, bilateral tubal ligation, and left ovarian cystectomy.  ASSISTANT:  Harold Hedge II, MD  ANESTHESIA:  Spinal.  ESTIMATED BLOOD LOSS: Per anesthesiology note.  FINDINGS:  Viable female infant. Apgars, arterial cord pH, birth weight pending.  SPECIMENS:  Bilateral fallopian tube segments and left ovarian cyst to pathology.  INDICATIONS AND CONSENT:  This patient is a 30 year old G2, P1, at 53 and 3/7th weeks.  She desires repeat cesarean section.  She also desires permanent sterilization.  Risks have been discussed preoperatively including but not limited to infection,  organ damage, bleeding requiring transfusion of blood products with HIV and hepatitis acquisition, DVT, PE, pneumonia, wound breakdown.   Tubal ligation is also discussed and the permanence, failure rate, and increased ectopic risk were reviewed.  The  patient states she understands and agrees and consent is signed and on the chart.  DESCRIPTION OF PROCEDURE:  The patient is taken to the operating room where she is identified.  Spinal anesthetic is placed per anesthesiologist and she is placed in the dorsal supine position with a 15-degree left lateral wedge.  She is prepped  vaginally with Betadine.  Foley catheter is placed and prepped abdominally with ChloraPrep.  She is noted to have a high spinal.  She remained stable while anesthesiology attends to her. After a 3-minute drying time and a timeout, we proceed  with  cesarean section without delay.  She was draped in a sterile fashion.  After testing for adequate spinal anesthesia, the old scar is removed on the way in as the Pfannenstiel incision is made and dissection is carried down to the peritoneum.  Peritoneum  is entered and extended superiorly and inferiorly.  Vesicouterine peritoneum is taken down cephalad laterally.  Bladder flap developed.  Bladder blade is placed.  Uterus is incised in a low transverse manner and the uterine cavity is entered bluntly with  a hemostat.  Clear fluid is noted.  The uterine incision is extended with the fingers.  Baby is delivered from the vertex position without difficulty.  Good cry and tone is noted.  After 1 minute, cord is clamped and cut and the baby is handed to  waiting pediatrics team.  Placenta is manually delivered.  Uterine cavity is clean.  Uterus is closed in 2 running locking imbricating layers of 0 Monocryl suture, which achieves good hemostasis.  The left fallopian tube is identified from cornu to  fimbria.  At this point, a 3 cm pedunculated paraovarian cyst on the distal pole is noted.  It is translucent.  It is entered with Bovie and dark hemorrhagic fluid is noted to evacuate.  The base of the cyst is clamped with a Tresa Endo and the cyst removed  is removed in a simple fashion.  This also removed some of the fimbria of the fallopian tube.  This is ligated with first a suture, then a free tie of 0 Monocryl suture.  Good hemostasis is noted.  Next, the mid ampullary portion of the left fallopian  tube is identified, grasped with a Babcock clamp and  doubly ligated with 2 free ties of plain suture.  The intervening knuckle is then sharply resected.  Cautery is used to assure hemostasis.  Similarly, the right fallopian tube is identified from cornu  to fimbria.  It is grasped at its mid ampullary portion and a knuckle of tube is doubly ligated with 2 free ties of plain suture.  The intervening knuckle is then  sharply resected and hemostasis is obtained with cautery.  Both ovaries are normal.  Lavage  is carried out and all returns is clear.  Anterior peritoneum is closed in a running fashion with 0 Monocryl suture, which is also used to reapproximate the pyramidalis muscle in the midline.  Anterior rectus fascia is closed in a running fashion with 0  looped PDS suture.  Subcutaneous layer is closed with interrupted plain and the skin is closed in a subcuticular fashion with a 4-0 Vicryl on a Keith needle.  Benzoin, Steri-Strips, honeycomb and pressure dressing are applied.  All counts correct and  the patient is taken to the recovery room in stable condition.   SHW D: 04/19/2021 8:47:06 am T: 04/19/2021 10:00:00 am  JOB: [id number]/ [id number]

## 2021-04-19 NOTE — Progress Notes (Signed)
No changes to H&P per patient history. Reviewed procedure-repeat cesarean section and bilateral tubal ligation. Discussed risks including infection, organ damage, bleeding/transfusion-HIV/Hep, DVT/PE, pneumonia, wound breakdown. Also discussed BTL-permanence, failure and increased ectopic risk. She states she understands and agrees.

## 2021-04-19 NOTE — Anesthesia Postprocedure Evaluation (Signed)
Anesthesia Post Note  Patient: Leah Cox  Procedure(s) Performed: REPEAT CESAREAN SECTION EDC: 04-23-21 PREVIOUS X 1     Patient location during evaluation: PACU Anesthesia Type: Spinal Level of consciousness: awake and alert Pain management: pain level controlled Vital Signs Assessment: post-procedure vital signs reviewed and stable Respiratory status: spontaneous breathing Cardiovascular status: stable Anesthetic complications: yes Comments: High spinal intraop. No airway or cardiovascular compromise.    No notable events documented.  Last Vitals:  Vitals:   04/19/21 0957 04/19/21 1100  BP: (!) 118/59 107/67  Pulse: 63 (!) 55  Resp: 16 18  Temp: 36.4 C (!) 36.4 C  SpO2: 97% 98%    Last Pain:  Vitals:   04/19/21 1100  TempSrc: Oral  PainSc: 6                  [name] [employer]

## 2021-04-19 NOTE — Lactation Note (Signed)
This note was copied from a baby's chart. Lactation Consultation Note  Patient Name: Leah Cox Date: 04/19/2021 Reason for consult: Initial assessment;Term;1st time breastfeeding Age:28 hours   P2 mother whose infant is now 5 hours old.  This is a term baby at 39+3 weeks.  Mother did not breast feed her first child (now 35 year old) except for colostrum drops in the hospital.    Mother requested latch assistance.  Taught hand expression and mother able to express colostrum drops which I finger fed back to baby.  Assisted to latch in the cross cradle position on the right breast easily.  Once at the breast, baby required constant stimulation to begin sucking.  Mother denied pain with feeding.  Demonstrated breast compressions and encouraged father to assist mother with stimulation to keep baby awake.    Reviewed breast feeding basics.  Mother will feed 8-12 times/24 hours or sooner if baby shows cues.  Advised to call her RN/LC for latch assistance as needed.  Mother appreciative of help provided.  Father present and on his cell phone.     Maternal Data Has patient been taught Hand Expression?: Yes Does the patient have breastfeeding experience prior to this delivery?: No (Attempted for 2 days in the hospital only)  Feeding Mother's Current Feeding Choice: Breast Milk  LATCH Score Latch: Repeated attempts needed to sustain latch, nipple held in mouth throughout feeding, stimulation needed to elicit sucking reflex.  Audible Swallowing: None  Type of Nipple: Everted at rest and after stimulation  Comfort (Breast/Nipple): Soft / non-tender  Hold (Positioning): Assistance needed to correctly position infant at breast and maintain latch.  LATCH Score: 6   Lactation Tools Discussed/Used    Interventions Interventions: Breast feeding basics reviewed;Assisted with latch;Skin to skin;Breast massage;Hand express;Breast compression;Adjust position;Position options;Support  pillows;Education  Discharge    Consult Status Consult Status: Follow-up Date: 04/20/21 Follow-up type: In-patient    Leah Cox R Zamya Culhane 04/19/2021, 1:09 PM

## 2021-04-20 LAB — CBC
HCT: 29.4 % — ABNORMAL LOW (ref 36.0–46.0)
Hemoglobin: 9.3 g/dL — ABNORMAL LOW (ref 12.0–15.0)
MCH: 23.3 pg — ABNORMAL LOW (ref 26.0–34.0)
MCHC: 31.6 g/dL (ref 30.0–36.0)
MCV: 73.7 fL — ABNORMAL LOW (ref 80.0–100.0)
Platelets: 151 10*3/uL (ref 150–400)
RBC: 3.99 MIL/uL (ref 3.87–5.11)
RDW: 16.8 % — ABNORMAL HIGH (ref 11.5–15.5)
WBC: 18.9 10*3/uL — ABNORMAL HIGH (ref 4.0–10.5)
nRBC: 0 % (ref 0.0–0.2)

## 2021-04-20 LAB — SURGICAL PATHOLOGY

## 2021-04-20 NOTE — Lactation Note (Signed)
This note was copied from a baby's chart. Lactation Consultation Note LC offered assistance in latching. Mom kindly declined at this time. Informed mom that LC would be here all night to call if needs assistance.  Patient Name: Leah Cox OEHOZ'Y Date: 04/20/2021   Age:28 hours  Maternal Data    Feeding    LATCH Score Latch:  (ENC TO CALL WHEN FEEDS AGAIN)                  Lactation Tools Discussed/Used    Interventions    Discharge    Consult Status      Charyl Dancer 04/20/2021, 10:15 PM

## 2021-04-20 NOTE — Progress Notes (Addendum)
Subjective: Postpartum Day 1: Cesarean Delivery - scheduled repeat with BTL and left ovarian cystectomy. Patient reports tolerating PO and no problems voiding.    Objective: Vital signs in last 24 hours: Temp:  [97.5 F (36.4 C)-98.3 F (36.8 C)] 97.5 F (36.4 C) (07/15 0611) Pulse Rate:  [55-83] 75 (07/15 0611) Resp:  [12-18] 18 (07/15 0611) BP: (106-120)/(59-76) 112/76 (07/15 0611) SpO2:  [96 %-100 %] 100 % (07/15 5916)  Physical Exam:  General: alert and cooperative Lochia: appropriate Uterine Fundus: firm Incision: healing well, no significant drainage, no dehiscence, no significant erythema DVT Evaluation: No evidence of DVT seen on physical exam.  Recent Labs    04/17/21 0930 04/20/21 0503  HGB 11.8* 9.3*  HCT 37.2 29.4*    Assessment/Plan: Status post Cesarean section. Doing well postoperatively.  Continue current care. D/W patient female infant circumcision, risks/benefits reviewed. All questions answered.   Leah Cox 04/20/2021, 9:09 AM

## 2021-04-20 NOTE — Plan of Care (Deleted)
Pt. Admitted to Rm. 523.  RN oriented patient to room; instructed how to call for help as well as how to order food and work the TV; safety precautions reviewed; will continue to monitor.            

## 2021-04-21 MED ORDER — FERROUS SULFATE 325 (65 FE) MG PO TBEC: 325.0000 mg | DELAYED_RELEASE_TABLET | Freq: Two times a day (BID) | ORAL | 2 refills | Status: AC

## 2021-04-21 MED ORDER — IBUPROFEN 600 MG PO TABS: 600.0000 mg | ORAL_TABLET | Freq: Four times a day (QID) | ORAL | 0 refills | Status: AC | PRN

## 2021-04-21 MED ORDER — DOCUSATE SODIUM 100 MG PO CAPS: 100.0000 mg | ORAL_CAPSULE | Freq: Two times a day (BID) | ORAL | 2 refills | Status: DC

## 2021-04-21 MED ORDER — TRAMADOL HCL 50 MG PO TABS: 50.0000 mg | ORAL_TABLET | Freq: Four times a day (QID) | ORAL | 0 refills | Status: AC | PRN

## 2021-04-21 NOTE — Discharge Summary (Signed)
Postpartum Discharge Summary  Date of Service updated 04/21/21     Patient Name: Leah Cox DOB: 1990-12-15 MRN: [id number]  Date of admission: 04/19/2021 Delivery date:04/19/2021  Delivering provider: Everlene Farrier  Date of discharge: 04/21/2021  Admitting diagnosis: Breech presentation [O32.1XX0] Intrauterine pregnancy: [redacted]w[redacted]d    Secondary diagnosis:  Active Problems:   Breech presentation  Additional problems: none    Discharge diagnosis: Term Pregnancy Delivered                                              Post partum procedures: none Augmentation: N/A Complications: None  Hospital course: Sceduled C/S   30y.o. yo G2P2002 at 374w3das admitted to the hospital 04/19/2021 for scheduled cesarean section with the following indication:Elective Repeat.Delivery details are as follows:  Membrane Rupture Time/Date: 7:52 AM ,04/19/2021   Delivery Method:C-Section, Low Transverse  with bilateral tubal ligation and left ovarian cystectomy. Details of operation can be found in separate operative note.  Patient had an uncomplicated postpartum course.  She is ambulating, tolerating a regular diet, passing flatus, and urinating well. Patient is discharged home in stable condition on  04/21/21        Newborn Data: Birth date:04/19/2021  Birth time:7:53 AM  Gender:Female  Living status:Living  Apgars:9 ,9  Weight:3650 g     Magnesium Sulfate received: No BMZ received: No Rhophylac:N/A MMR:N/A T-DaP:Given prenatally Flu: N/A Transfusion:No  Physical exam  Vitals:   04/20/21 0022 04/20/21 0611 04/20/21 1839 04/21/21 0559  BP: 120/71 112/76 115/80 123/77  Pulse: 83 75 89 77  Resp: '18 18  18  ' Temp: 98.3 F (36.8 C) (!) 97.5 F (36.4 C) 97.8 F (36.6 C) 98.1 F (36.7 C)  TempSrc: Oral Oral Oral Oral  SpO2: 99% 100%    Weight:      Height:       General: alert, cooperative, and no distress Lochia: appropriate Uterine Fundus: firm Incision: Healing well with no  significant drainage, No significant erythema DVT Evaluation: No evidence of DVT seen on physical exam. Labs: Lab Results  Component Value Date   WBC 18.9 (H) 04/20/2021   HGB 9.3 (L) 04/20/2021   HCT 29.4 (L) 04/20/2021   MCV 73.7 (L) 04/20/2021   PLT 151 04/20/2021   No flowsheet data found. Edinburgh Score: Edinburgh Postnatal Depression Scale Screening Tool 04/19/2021  I have been able to laugh and see the funny side of things. 0  I have looked forward with enjoyment to things. 0  I have blamed myself unnecessarily when things went wrong. 0  I have been anxious or worried for no good reason. 0  I have felt scared or panicky for no good reason. 0  Things have been getting on top of me. 0  I have been so unhappy that I have had difficulty sleeping. 0  I have felt sad or miserable. 0  I have been so unhappy that I have been crying. 0  The thought of harming myself has occurred to me. 0  Edinburgh Postnatal Depression Scale Total 0      After visit meds:  Allergies as of 04/21/2021       Reactions   Codeine Swelling   Latex Rash   Nickel Rash        Medication List     TAKE these medications  acetaminophen 325 MG tablet Commonly known as: TYLENOL Take 2 tablets (650 mg total) by mouth every 4 (four) hours as needed for mild pain (temperature > 101.5.).   cetirizine 10 MG tablet Commonly known as: ZYRTEC Take 10 mg by mouth daily as needed for allergies.   docusate sodium 100 MG capsule Commonly known as: Colace Take 1 capsule (100 mg total) by mouth 2 (two) times daily.   ferrous sulfate 325 (65 FE) MG tablet Take 325 mg by mouth daily with breakfast. What changed: Another medication with the same name was added. Make sure you understand how and when to take each.   ferrous sulfate 325 (65 FE) MG EC tablet Take 1 tablet (325 mg total) by mouth 2 (two) times daily. What changed: You were already taking a medication with the same name, and this  prescription was added. Make sure you understand how and when to take each.   ibuprofen 600 MG tablet Commonly known as: ADVIL Take 1 tablet (600 mg total) by mouth every 6 (six) hours as needed.   prenatal multivitamin Tabs tablet Take 1 tablet by mouth daily at 12 noon.   traMADol 50 MG tablet Commonly known as: ULTRAM Take 1 tablet (50 mg total) by mouth every 6 (six) hours as needed for up to 7 days for moderate pain.         Discharge home in stable condition Infant Feeding: Bottle and Breast Infant Disposition:home with mother Discharge instruction: per After Visit Summary and Postpartum booklet. Activity: Advance as tolerated. Pelvic rest for 6 weeks.  Diet: routine diet Anticipated Birth Control:  btl done at time of CS Postpartum Appointment:6 weeks Additional Postpartum F/U:  none Future Appointments:No future appointments. Follow up Visit:      04/21/2021 Tyson Dense, MD

## 2021-04-21 NOTE — Lactation Note (Addendum)
This note was copied from a baby's chart. Lactation Consultation Note Baby attempted to BF, suckled a few times at the breast. Baby tried to spit up a couple of times, swallowed it back down. Baby isn't interested in BF at this time. LC spoon fed 1 ml colostrum mom hand expressed. Mom had been holding baby STS.  LC swaddled baby placed in bassinet.  Discussed w/mom that the baby should be wanting to cluster feed. Asked mom if she would be interested in pumping, mom stated yes. Mom shown how to use DEBP & how to disassemble, clean, & reassemble parts. Mom knows to pump q3h for 15-20 min.  Mom pumping when LC left. Mom encouraged to feed baby 8-12 times/24 hours and with feeding cues.   Milk storage reviewed.  Patient Name: Leah Cox BWGYK'Z Date: 04/21/2021 Reason for consult: Mother's request;Difficult latch;Term Age:49 hours  Maternal Data Has patient been taught Hand Expression?: Yes Does the patient have breastfeeding experience prior to this delivery?: No  Feeding    LATCH Score Latch: Too sleepy or reluctant, no latch achieved, no sucking elicited.  Audible Swallowing: None  Type of Nipple: Everted at rest and after stimulation (short shaft)  Comfort (Breast/Nipple): Soft / non-tender  Hold (Positioning): Assistance needed to correctly position infant at breast and maintain latch.  LATCH Score: 5   Lactation Tools Discussed/Used    Interventions Interventions: Breast feeding basics reviewed;Support pillows;Assisted with latch;Position options;Skin to skin;Breast massage;Hand express;Breast compression;Adjust position  Discharge    Consult Status Consult Status: Follow-up Date: 04/21/21 Follow-up type: In-patient    Charyl Dancer 04/21/2021, 1:34 AM

## 2021-04-21 NOTE — Lactation Note (Signed)
This note was copied from a baby's chart. Lactation Consultation Note  Patient Name: Leah Cox JGGEZ'M Date: 04/21/2021 Reason for consult: Follow-up assessment Age:30 years  LC in to room for follow up. Parents are expecting discharge. Discussed normal newborn behavior and patterns in addition to clusterfeeding. Talked about milk coming into volume. LC assisted with latch, alignment, positioning, support pillows, neck/back support and asymmetrical latch. Mother has plentiful supply and knows how to hand express. Infant is still breastfeeding upon leaving room. Plan: 1-Skin to skin 2-Aim for a deep, comfortable latch 3-Breastfeeding on demand or 8-12 times in 24h period. 4-Keep infant awake during breastfeeding session: massaging breast, infant's hand/shoulder/feet 5-Monitor voids and stools as signs good intake.  6-Encouraged maternal rest, hydration and food intake.  7-Contact LC as needed for feeds/support/concerns/questions   All questions answered at this time. Reviewed LC brochure and [employer] booklet.     Maternal Data Has patient been taught Hand Expression?: Yes  Feeding Mother's Current Feeding Choice: Breast Milk  LATCH Score Latch: Grasps breast easily, tongue down, lips flanged, rhythmical sucking.  Audible Swallowing: Spontaneous and intermittent  Type of Nipple: Everted at rest and after stimulation  Comfort (Breast/Nipple): Soft / non-tender  Hold (Positioning): Assistance needed to correctly position infant at breast and maintain latch.  LATCH Score: 9  Interventions Interventions: Breast feeding basics reviewed;Assisted with latch;Skin to skin;Breast massage;Hand express;Adjust position;Support pillows;Expressed milk;Education  Discharge Discharge Education: Engorgement and breast care;Warning signs for feeding baby Pump: Personal;Manual  Consult Status Consult Status: Complete Date: 04/21/21 Follow-up type: Call as needed    Hermelinda Diegel A Higuera  Ancidey 04/21/2021, 11:58 AM

## 2021-04-21 NOTE — Plan of Care (Signed)
  Problem: Pain Managment: Goal: General experience of comfort will improve Outcome: Completed/Met   Problem: Safety: Goal: Ability to remain free from injury will improve Outcome: Completed/Met   Problem: Skin Integrity: Goal: Risk for impaired skin integrity will decrease Outcome: Completed/Met   Problem: Coping: Goal: Ability to identify and utilize available resources and services will improve Outcome: Completed/Met   Problem: Life Cycle: Goal: Chance of risk for complications during the postpartum period will decrease Outcome: Completed/Met   Problem: Role Relationship: Goal: Ability to demonstrate positive interaction with newborn will improve Outcome: Completed/Met

## 2021-05-02 ENCOUNTER — Telehealth (HOSPITAL_COMMUNITY): Payer: Self-pay | Admitting: *Deleted

## 2021-05-02 NOTE — Telephone Encounter (Signed)
Mom reports feeling well. Incision healing without difficulty. No concerns about herself. EPDS=0 [facility] score =0) Mom reports baby doing well. Feeding, peeing, and pooping well. Breast and formula feeding. Sleeps in bassinet on back by moms bed. No concerns about baby.  Duffy Rhody, RN 05/02/2021 at 2:32pm

## 2022-02-04 DIAGNOSIS — N939 Abnormal uterine and vaginal bleeding, unspecified: Secondary | ICD-10-CM | POA: Diagnosis not present

## 2022-02-04 DIAGNOSIS — N920 Excessive and frequent menstruation with regular cycle: Secondary | ICD-10-CM | POA: Diagnosis not present

## 2022-05-21 DIAGNOSIS — Z309 Encounter for contraceptive management, unspecified: Secondary | ICD-10-CM | POA: Diagnosis not present

## 2022-06-29 DIAGNOSIS — M7652 Patellar tendinitis, left knee: Secondary | ICD-10-CM | POA: Diagnosis not present

## 2022-07-08 DIAGNOSIS — N920 Excessive and frequent menstruation with regular cycle: Secondary | ICD-10-CM | POA: Diagnosis not present

## 2022-07-08 DIAGNOSIS — Z124 Encounter for screening for malignant neoplasm of cervix: Secondary | ICD-10-CM | POA: Diagnosis not present

## 2022-07-08 DIAGNOSIS — Z01419 Encounter for gynecological examination (general) (routine) without abnormal findings: Secondary | ICD-10-CM | POA: Diagnosis not present

## 2022-10-09 DIAGNOSIS — H5213 Myopia, bilateral: Secondary | ICD-10-CM | POA: Diagnosis not present

## 2022-11-13 DIAGNOSIS — H5203 Hypermetropia, bilateral: Secondary | ICD-10-CM | POA: Diagnosis not present

## 2023-02-02 DIAGNOSIS — M25571 Pain in right ankle and joints of right foot: Secondary | ICD-10-CM | POA: Diagnosis not present

## 2023-02-02 DIAGNOSIS — L0501 Pilonidal cyst with abscess: Secondary | ICD-10-CM | POA: Diagnosis not present

## 2023-04-06 IMAGING — US US MFM OB DETAIL+14 WK
1 series · 13 of 28 positions shown · non-contrast
Comparison: none

[Series 1: us mfm ob detail+14 wk · 100 acquisitions, 13 frames shown]
[im 4/100]
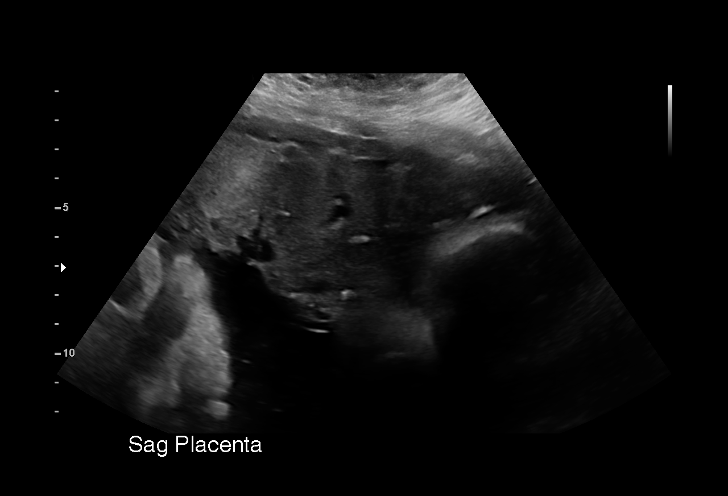
[im 12/100]
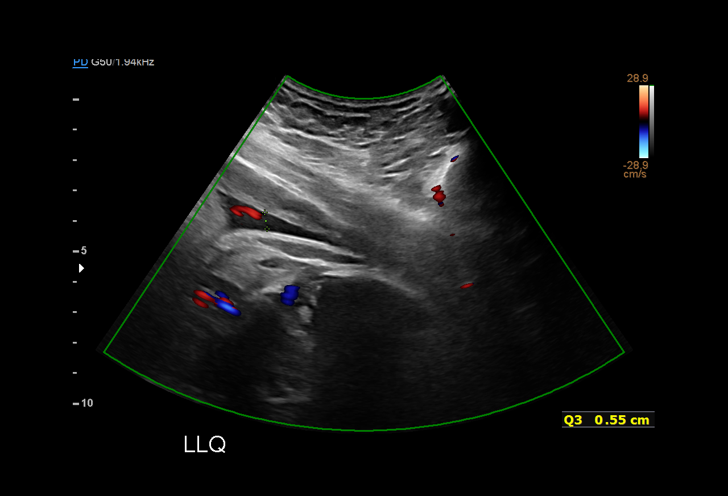
[im 19/100]
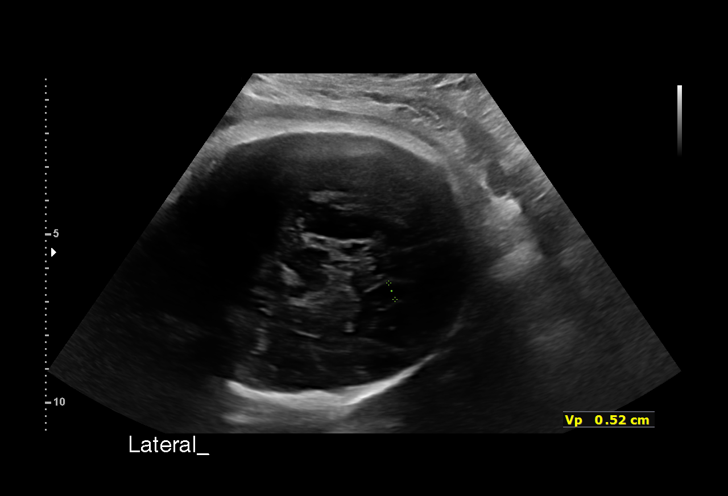
[im 26/100]
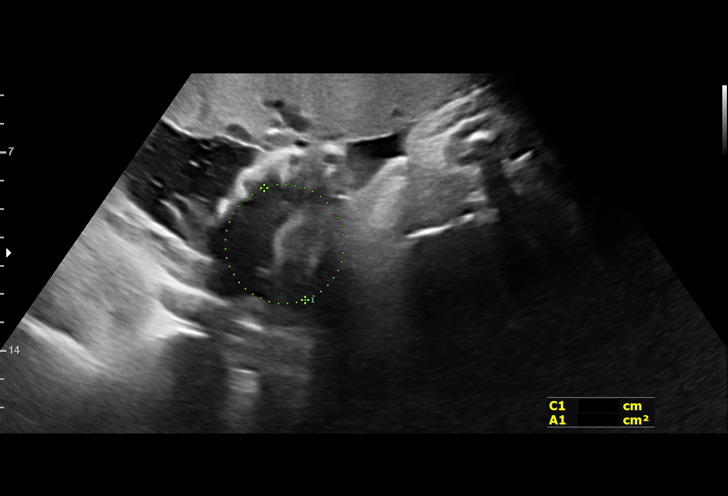
[im 34/100]
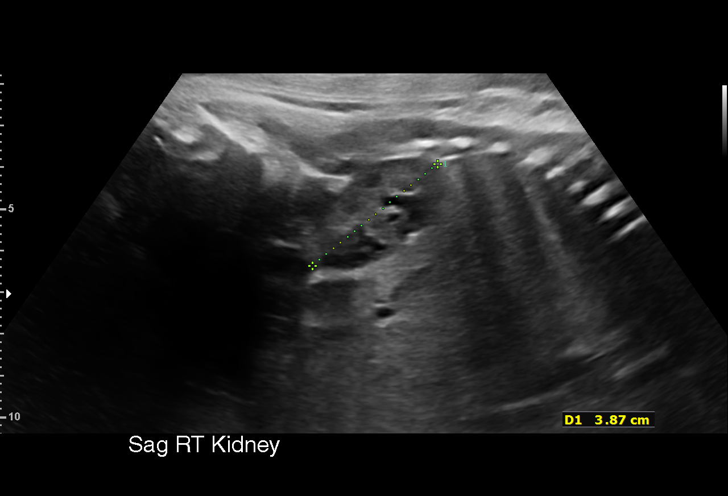
[im 41/100]
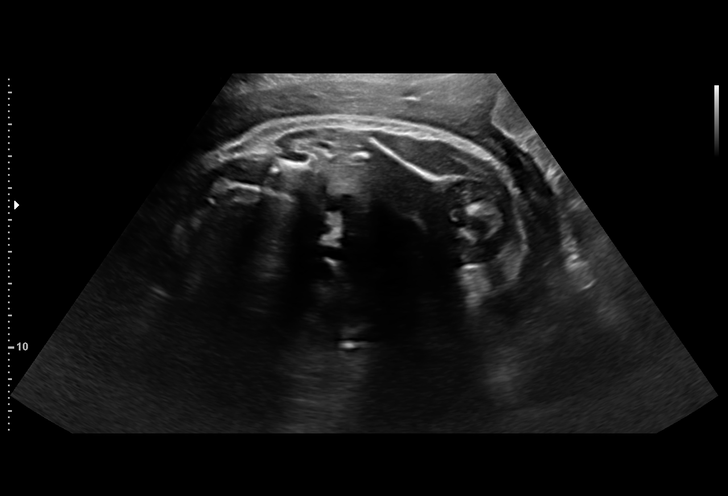
[im 52/100]
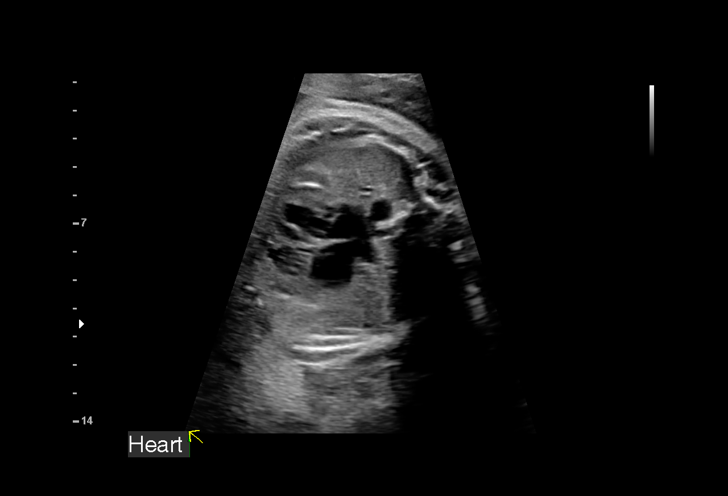
[im 59/100]
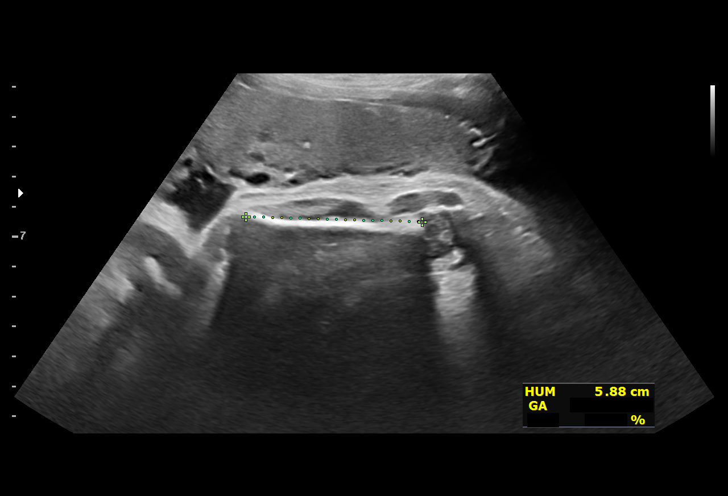
[im 67/100]
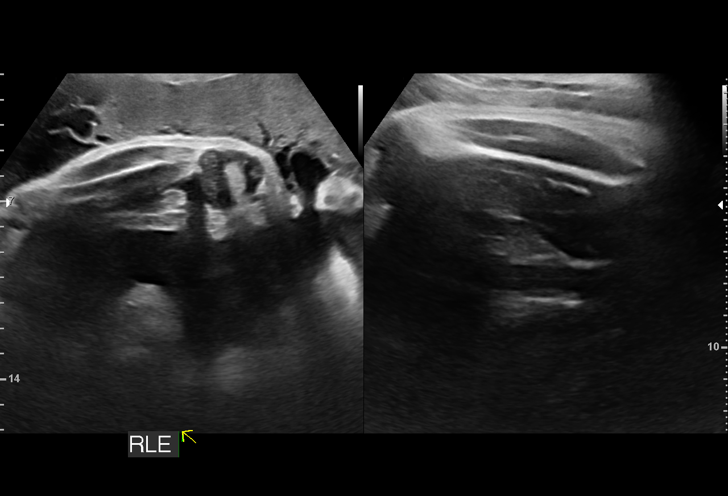
[im 74/100]
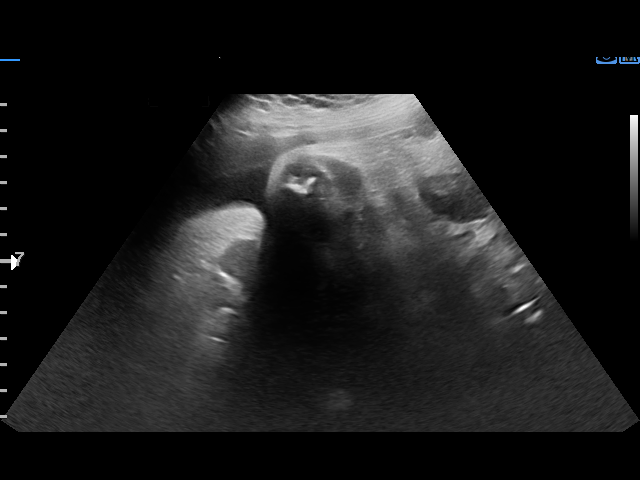
[im 81/100]
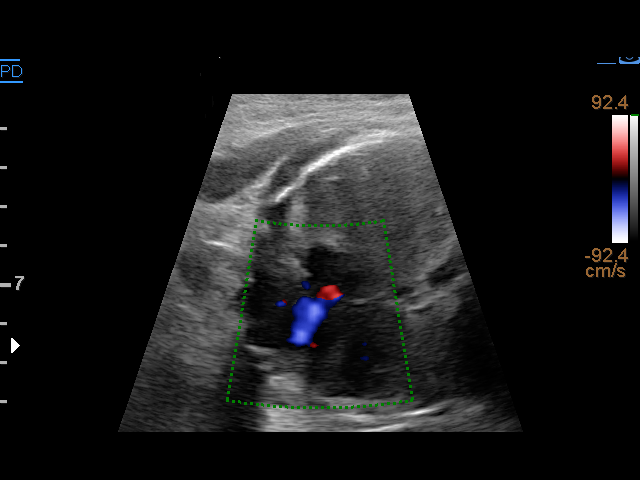
[im 89/100]
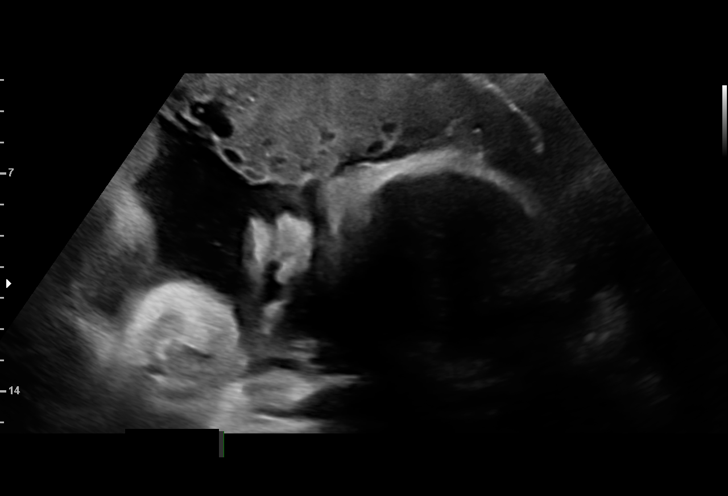
[im 96/100]
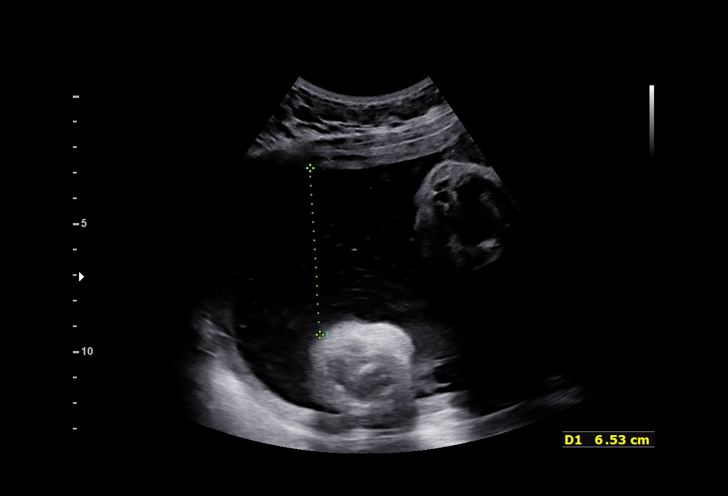

[13 of 28 positions shown; findings below may reference images not displayed]

[REDACTED]

Indications

 Abnormal fetal ultrasound(head
 circumference)
 37 weeks gestation of pregnancy
 Encounter for antenatal screening for
 malformations
 Neg Horizon, Neg EMIRCAKRI
 History of cesarean delivery, currently
 pregnant (x1)
 Herpes simplex virus ([facility] [facility])
Fetal Evaluation

 Num Of Fetuses:         1
 Fetal Heart Rate(bpm):  148
 Cardiac Activity:       Observed
 Presentation:           Cephalic
 Placenta:               Anterior
 P. Cord Insertion:      Visualized

 Amniotic Fluid
 AFI FV:      Within normal limits

 AFI Sum(cm)     %Tile       Largest Pocket(cm)
 18.3            70

 RUQ(cm)       RLQ(cm)       LUQ(cm)        LLQ(cm)

Biometry

 BPD:        85  mm     G. Age:  34w 2d        3.4  %    CI:        71.69   %    70 - 86
                                                         FL/HC:      21.3   %    20.8 -
 HC:      319.6  mm     G. Age:  36w 0d          6  %    HC/AC:      0.94        0.92 -
 AC:      338.5  mm     G. Age:  37w 5d         77  %    FL/BPD:     80.2   %    71 - 87
 FL:       68.2  mm     G. Age:  35w 0d          6  %    FL/AC:      20.1   %    20 - 24
 HUM:      59.8  mm     G. Age:  34w 5d         22  %
 CER:      49.6  mm     G. Age:  36w 5d         42  %

 LV:        5.2  mm

 Est. FW:    8616  gm      6 lb 8 oz     37  %
OB History

 Gravidity:    2
 Living:       1
Gestational Age

 LMP:           37w 2d        Date:  07/17/20                 EDD:   04/23/21
 U/S Today:     35w 5d                                        EDD:   05/04/21
 Best:          37w 2d     Det. By:  LMP  (07/17/20)          EDD:   04/23/21
Anatomy

 Cranium:               Appears normal         Aortic Arch:            Appears normal
 Cavum:                 Appears normal         Ductal Arch:            Not well visualized
 Ventricles:            Appears normal         Diaphragm:              Appears normal
 Choroid Plexus:        Appears normal         Stomach:                Appears normal, left
                                                                       sided
 Cerebellum:            Appears normal         Abdomen:                Appears normal
 Posterior Fossa:       Not well visualized    Abdominal Wall:         Not well visualized
 Nuchal Fold:           Not applicable (>20    Cord Vessels:           Appears normal (3
                        wks GA)                                        vessel cord)
 Face:                  Orbits nl; profile not Kidneys:                Appear normal
                        well visualized
 Lips:                  Appears normal         Bladder:                Appears normal
 Thoracic:              Appears normal         Spine:                  Appears normal
 Heart:                 Appears normal         Upper Extremities:      Visualized
                        (4CH, axis, and
                        situs)
 RVOT:                  Not well visualized    Lower Extremities:      RLE Vis, LLE vNWV
 LVOT:                  Appears normal

 Other:  Fetus appears to be a male.Technicallly difficult due to advanced GA
         and maternal habitus.
Cervix Uterus Adnexa

 Cervix
 Not visualized (advanced GA >90wks)

 Uterus
 No abnormality visualized.
 Right Ovary
 Not visualized.

 Left Ovary
 Not visualized.

 Cul De Sac
 No free fluid seen.

 Adnexa
 No adnexal mass visualized.
Comments

 This patient was seen for a detailed fetal anatomy scan as
 the fetal head measurements were in the lower normal range
 during ultrasound exams performed in your office.  The
 patient denies any recent travel history and denies any
 problems in her current pregnancy
 She was informed that the fetal growth and amniotic fluid
 level were appropriate for her gestational age.
 The overall fetal head measurements (BPD and HC)
 measured in the lower range.  The patient was reassured that
 the fetal head often measures small later in pregnancy
 possibly as the fetal head is now lower down in the pelvis.
 She was reassured that most babies who were referred for
 smaller head measurements in the third trimester have
 normal outcomes after delivery.
 The views of the fetal anatomy were limited today due to her
 advanced gestational age.
 The patient was informed that anomalies may be missed due
 to technical limitations. If the fetus is in a suboptimal position
 or maternal habitus is increased, visualization of the fetus in
 the maternal uterus may be impaired.
 The patient already has a repeat cesarean delivery on Vasile
 No further exams were scheduled in our office.

## 2023-04-23 ENCOUNTER — Ambulatory Visit (HOSPITAL_COMMUNITY)
Admission: EM | Admit: 2023-04-23 | Discharge: 2023-04-23 | Disposition: A | Discharge status: Home / Self Care | Payer: Medicaid Other

## 2023-04-23 ENCOUNTER — Encounter (HOSPITAL_COMMUNITY): Payer: Self-pay

## 2023-04-23 DIAGNOSIS — G44209 Tension-type headache, unspecified, not intractable: Secondary | ICD-10-CM

## 2023-04-23 MED ORDER — KETOROLAC TROMETHAMINE 30 MG/ML IJ SOLN
INTRAMUSCULAR | Status: AC
  Filled 2023-04-23: qty 1

## 2023-04-23 MED ORDER — KETOROLAC TROMETHAMINE 30 MG/ML IJ SOLN
30.0000 mg | Freq: Once | INTRAMUSCULAR | Status: AC
  Administered 2023-04-23: 30 mg via INTRAMUSCULAR

## 2023-04-23 MED ORDER — BACLOFEN 10 MG PO TABS: 10.0000 mg | ORAL_TABLET | Freq: Two times a day (BID) | ORAL | 0 refills | Status: AC | PRN

## 2023-04-23 MED ORDER — DEXAMETHASONE SODIUM PHOSPHATE 10 MG/ML IJ SOLN
INTRAMUSCULAR | Status: AC
  Filled 2023-04-23: qty 1

## 2023-04-23 MED ORDER — DEXAMETHASONE SODIUM PHOSPHATE 10 MG/ML IJ SOLN
10.0000 mg | Freq: Once | INTRAMUSCULAR | Status: AC
  Administered 2023-04-23: 10 mg via INTRAMUSCULAR

## 2023-04-23 NOTE — ED Provider Notes (Signed)
[facility]    CSN: [id number] Arrival date & time: 04/23/23  4098      History   Chief Complaint Chief Complaint  Patient presents with   Migraine    HPI Leah Cox is a 32 y.o. female.   Patient presents today with a 72-hour history of persistent migraine headache.  She does have a history of headaches but has not seen a headache specialist/neurologist and denies formal diagnosis of migraines.  She reports that pain is rated 10 on a 0-10 pain scale, described as aching, localized to frontal head, no alleviating factors notified.  She has tried Tylenol without improvement of symptoms.  She woke up this morning and initially her vision was slightly blurry but this significantly improved after she had her eyes open for a few minutes.  She does report associated photophobia but denies any phonophobia, focal weakness, dysarthria, nausea, vomiting.  Denies any associated fever or recent illness.  She denies any head injury or medication changes.  She is confident that she is not pregnant.  She is currently on her menstrual cycle and wonders if this could have triggered the headache.  She is having difficulty with daily activities as a result of symptoms.  This is not the worst headache of her life.    Past Medical History:  Diagnosis Date   Allergy    seasonal   Anemia    HSV (herpes simplex virus) infection    Vaginal Pap smear, abnormal     Patient Active Problem List   Diagnosis Date Noted   Breech presentation 04/19/2021   Term pregnancy 09/07/2019    Past Surgical History:  Procedure Laterality Date   CESAREAN SECTION N/A 09/07/2019   Procedure: CESAREAN SECTION;  Surgeon: Harold Hedge, MD;  Location: [facility];  Service: Obstetrics;  Laterality: N/A;   CESAREAN SECTION N/A 04/19/2021   Procedure: REPEAT CESAREAN SECTION EDC: 04-23-21 PREVIOUS X 1;  Surgeon: Harold Hedge, MD;  Location: [facility];  Service: Obstetrics;  Laterality: N/A;    OB History      Gravida  2   Para  2   Term  2   Preterm      AB      Living  2      SAB      IAB      Ectopic      Multiple  0   Live Births  2            Home Medications    Prior to Admission medications   Medication Sig Start Date End Date Taking? Authorizing Provider  acetaminophen (TYLENOL) 325 MG tablet Take 2 tablets (650 mg total) by mouth every 4 (four) hours as needed for mild pain (temperature > 101.5.). 09/09/19  Yes Morris, Megan, DO  baclofen (LIORESAL) 10 MG tablet Take 1 tablet (10 mg total) by mouth 2 (two) times daily as needed for muscle spasms. 04/23/23  Yes Gilman Olazabal K, PA-C  norethindrone-ethinyl estradiol-FE (LOESTRIN FE) 1-20 MG-MCG tablet Take 1 tablet by mouth daily.   Yes [provider]  cetirizine (ZYRTEC) 10 MG tablet Take 10 mg by mouth daily as needed for allergies.    [provider]  ferrous sulfate 325 (65 FE) MG EC tablet Take 1 tablet (325 mg total) by mouth 2 (two) times daily. 04/21/21   Ranae Pila, MD  ferrous sulfate 325 (65 FE) MG tablet Take 325 mg by mouth daily with breakfast.  [provider]  ibuprofen (ADVIL) 600 MG tablet Take 1 tablet (600 mg total) by mouth every 6 (six) hours as needed. 04/21/21   Ranae Pila, MD    Family History Family History  Problem Relation Age of Onset   Hypertension Mother    Diabetes Sister        Insulin dependent    Social History Social History   Tobacco Use   Smoking status: Never   Smokeless tobacco: Never  Vaping Use   Vaping status: Never Used  Substance Use Topics   Alcohol use: No   Drug use: No     Allergies   Codeine, Latex, and Nickel   Review of Systems Review of Systems  Constitutional:  Positive for activity change. Negative for appetite change, fatigue and fever.  HENT:  Negative for congestion, sinus pressure, sneezing and sore throat.   Eyes:  Positive for photophobia. Negative for visual disturbance.   Respiratory:  Negative for cough.   Gastrointestinal:  Negative for abdominal pain, diarrhea, nausea and vomiting.  Neurological:  Positive for headaches. Negative for dizziness, seizures, syncope, facial asymmetry, speech difficulty, weakness, light-headedness and numbness.     Physical Exam Triage Vital Signs ED Triage Vitals [04/23/23 0932]  Encounter Vitals Group     BP 112/76     Systolic BP Percentile      Diastolic BP Percentile      Pulse Rate 80     Resp 16     Temp 98.7 F (37.1 C)     Temp Source Oral     SpO2 98 %     Weight 230 lb (104.3 kg)     Height 5\' 4"  (1.626 m)     Head Circumference      Peak Flow      Pain Score 10     Pain Loc      Pain Education      Exclude from Growth Chart    No data found.  Updated Vital Signs BP 112/76 (BP Location: Right Arm)   Pulse 80   Temp 98.7 F (37.1 C) (Oral)   Resp 16   Ht 5\' 4"  (1.626 m)   Wt 230 lb (104.3 kg)   LMP 04/23/2023 (Exact Date)   SpO2 98%   Breastfeeding No   BMI 39.48 kg/m   Visual Acuity Right Eye Distance:   Left Eye Distance:   Bilateral Distance:    Right Eye Near:   Left Eye Near:    Bilateral Near:     Physical Exam Vitals reviewed.  Constitutional:      General: She is awake. She is not in acute distress.    Appearance: Normal appearance. She is well-developed. She is not ill-appearing.     Comments: Very pleasant female appears stated age in no acute distress sitting comfortably in exam room  HENT:     Head: Normocephalic and atraumatic. No raccoon eyes, Battle's sign or contusion.     Right Ear: Ear canal and external ear normal. A middle ear effusion is present. No hemotympanum.     Left Ear: Ear canal and external ear normal. A middle ear effusion is present. No hemotympanum.     Nose: Nose normal.     Mouth/Throat:     Tongue: Tongue does not deviate from midline.     Pharynx: Uvula midline. No oropharyngeal exudate or posterior oropharyngeal erythema.  Eyes:      Extraocular Movements: Extraocular movements intact.  Conjunctiva/sclera: Conjunctivae normal.     Pupils: Pupils are equal, round, and reactive to light.  Cardiovascular:     Rate and Rhythm: Normal rate and regular rhythm.     Heart sounds: Normal heart sounds, S1 normal and S2 normal. No murmur heard. Pulmonary:     Effort: Pulmonary effort is normal.     Breath sounds: Normal breath sounds. No wheezing, rhonchi or rales.     Comments: Clear to auscultation bilaterally Musculoskeletal:     Comments: Strength 5/5 bilateral upper and lower extremities  Neurological:     General: No focal deficit present.     Mental Status: She is alert and oriented to person, place, and time.     Cranial Nerves: Cranial nerves 2-12 are intact.     Motor: Motor function is intact.     Coordination: Coordination is intact.     Gait: Gait is intact.     Comments: Cranial nerves II through XII grossly intact.  No focal neurologic defect on exam.  Psychiatric:        Behavior: Behavior is cooperative.      UC Treatments / Results  Labs (all labs ordered are listed, but only abnormal results are displayed) Labs Reviewed - No data to display  EKG   Radiology No results found.  Procedures Procedures (including critical care time)  Medications Ordered in UC Medications  ketorolac (TORADOL) 30 MG/ML injection 30 mg (30 mg Intramuscular Given 04/23/23 1003)  dexamethasone (DECADRON) injection 10 mg (10 mg Intramuscular Given 04/23/23 1003)    Initial Impression / Assessment and Plan / UC Course  I have reviewed the triage vital signs and the nursing notes.  Pertinent labs & imaging results that were available during my care of the patient were reviewed by me and considered in my medical decision making (see chart for details).     Patient is well-appearing, afebrile, nontoxic, nontachycardic.  Vital signs and physical exam are reassuring with no indication for emergent evaluation or  imaging.  Patient was given 10 mg of Decadron and 30 mg of Toradol in clinic with significant improvement of symptoms.  Her pain reduced to 2/3 on a 0-10 pain scale.  Given she was given Toradol she was instructed to avoid NSAIDs for 24 hours but can use Tylenol for additional pain relief.  She was given baclofen to help manage the headache with instruction not to drive or drink alcohol with taking this medication.  Given her recurrent headaches I did recommend she follow-up with her primary care to consider referral to neurology/headache clinic.  She was encouraged to rest and drink plenty of fluids.  She was provided a work excuse note.  We discussed that if she has any worsening symptoms including recurrent severe headache, worst headache of her life, nausea/vomiting interfering with oral intake, weakness, visual disturbance, dysarthria she needs to be seen emergently.  Strict return precautions given to which she expressed understanding.  Final Clinical Impressions(s) / UC Diagnoses   Final diagnoses:  Tension headache     Discharge Instructions      I am glad that you are feeling better after your medication.  Because we gave you ketorolac/Toradol today please do not take any NSAIDs for the next 24 hours including aspirin, ibuprofen/Advil, naproxen/Aleve.  Take Tylenol as needed.  We have also given you baclofen.  This will make you sleepy so do not drive or drink alcohol with taking it.  Make sure that you rest and drink plenty of  fluid.  Follow-up with your primary care to consider referral to headache clinic.  If at any point you have worsening symptoms including increasing pain, worst headache of your life, weakness, nausea, vomiting, vision change you need to be seen immediately.     ED Prescriptions     Medication Sig Dispense Auth. Provider   baclofen (LIORESAL) 10 MG tablet Take 1 tablet (10 mg total) by mouth 2 (two) times daily as needed for muscle spasms. 20 each Tanay Massiah, Noberto Retort,  PA-C      PDMP not reviewed this encounter.   Jeani Hawking, PA-C 04/23/23 1035

## 2023-04-23 NOTE — ED Triage Notes (Signed)
Patient here today with c/o constant migraine since Friday-Saturday. She has been taking Tylenol ES with no relief. She is having some sensitivity to light. This morning when she woke up she had burry vision.

## 2023-04-23 NOTE — Discharge Instructions (Addendum)
I am glad that you are feeling better after your medication.  Because we gave you ketorolac/Toradol today please do not take any NSAIDs for the next 24 hours including aspirin, ibuprofen/Advil, naproxen/Aleve.  Take Tylenol as needed.  We have also given you baclofen.  This will make you sleepy so do not drive or drink alcohol with taking it.  Make sure that you rest and drink plenty of fluid.  Follow-up with your primary care to consider referral to headache clinic.  If at any point you have worsening symptoms including increasing pain, worst headache of your life, weakness, nausea, vomiting, vision change you need to be seen immediately.

## 2023-07-04 ENCOUNTER — Ambulatory Visit (HOSPITAL_COMMUNITY): Payer: Medicaid Other

## 2023-07-08 ENCOUNTER — Ambulatory Visit (HOSPITAL_COMMUNITY): Payer: Medicaid Other
